# Patient Record
Sex: Female | Born: 1935
Health system: Southern US, Community
[De-identification: ages and names within clinical notes are randomized; demographics above are authoritative.]

## PROBLEM LIST (undated history)

## (undated) DIAGNOSIS — E039 Hypothyroidism, unspecified: Secondary | ICD-10-CM

## (undated) DIAGNOSIS — Z1839 Other retained organic fragments: Secondary | ICD-10-CM

## (undated) DIAGNOSIS — R21 Rash and other nonspecific skin eruption: Secondary | ICD-10-CM

## (undated) DIAGNOSIS — Z8719 Personal history of other diseases of the digestive system: Secondary | ICD-10-CM

## (undated) DIAGNOSIS — F329 Major depressive disorder, single episode, unspecified: Secondary | ICD-10-CM

## (undated) DIAGNOSIS — K529 Noninfective gastroenteritis and colitis, unspecified: Secondary | ICD-10-CM

## (undated) DIAGNOSIS — R269 Unspecified abnormalities of gait and mobility: Secondary | ICD-10-CM

## (undated) DIAGNOSIS — H269 Unspecified cataract: Secondary | ICD-10-CM

## (undated) DIAGNOSIS — F32A Depression, unspecified: Secondary | ICD-10-CM

## (undated) DIAGNOSIS — M1990 Unspecified osteoarthritis, unspecified site: Secondary | ICD-10-CM

## (undated) DIAGNOSIS — E785 Hyperlipidemia, unspecified: Secondary | ICD-10-CM

## (undated) DIAGNOSIS — R911 Solitary pulmonary nodule: Secondary | ICD-10-CM

## (undated) DIAGNOSIS — D494 Neoplasm of unspecified behavior of bladder: Secondary | ICD-10-CM

## (undated) HISTORY — DX: Unspecified abnormalities of gait and mobility: R26.9

## (undated) HISTORY — PX: TOTAL ABDOMINAL HYSTERECTOMY W/ BILATERAL SALPINGOOPHORECTOMY: SHX83

## (undated) HISTORY — DX: Solitary pulmonary nodule: R91.1

## (undated) HISTORY — PX: CATARACT EXTRACTION: SUR2

## (undated) HISTORY — PX: VARICOSE VEIN SURGERY: SHX832

## (undated) HISTORY — DX: Unspecified cataract: H26.9

## (undated) HISTORY — PX: OTHER SURGICAL HISTORY: SHX169

## (undated) HISTORY — DX: Noninfective gastroenteritis and colitis, unspecified: K52.9

---

## 1997-10-10 ENCOUNTER — Other Ambulatory Visit: Admission: RE | Admit: 1997-10-10 | Discharge: 1997-10-10 | Payer: Self-pay | Admitting: Internal Medicine

## 1998-10-12 ENCOUNTER — Other Ambulatory Visit: Admission: RE | Admit: 1998-10-12 | Discharge: 1998-10-12 | Payer: Self-pay | Admitting: Internal Medicine

## 1999-02-28 ENCOUNTER — Encounter: Payer: Self-pay | Admitting: Internal Medicine

## 1999-02-28 ENCOUNTER — Encounter: Admission: RE | Admit: 1999-02-28 | Discharge: 1999-02-28 | Payer: Self-pay | Admitting: Internal Medicine

## 1999-06-22 ENCOUNTER — Encounter: Payer: Self-pay | Admitting: Internal Medicine

## 1999-06-22 ENCOUNTER — Encounter: Admission: RE | Admit: 1999-06-22 | Discharge: 1999-06-22 | Payer: Self-pay | Admitting: Internal Medicine

## 1999-11-02 ENCOUNTER — Other Ambulatory Visit: Admission: RE | Admit: 1999-11-02 | Discharge: 1999-11-02 | Payer: Self-pay | Admitting: Internal Medicine

## 2000-01-08 ENCOUNTER — Encounter: Payer: Self-pay | Admitting: Internal Medicine

## 2000-01-08 ENCOUNTER — Encounter: Admission: RE | Admit: 2000-01-08 | Discharge: 2000-01-08 | Payer: Self-pay | Admitting: Internal Medicine

## 2000-05-20 ENCOUNTER — Encounter: Payer: Self-pay | Admitting: Internal Medicine

## 2000-05-20 ENCOUNTER — Encounter: Admission: RE | Admit: 2000-05-20 | Discharge: 2000-05-20 | Payer: Self-pay | Admitting: Internal Medicine

## 2000-11-13 ENCOUNTER — Other Ambulatory Visit: Admission: RE | Admit: 2000-11-13 | Discharge: 2000-11-13 | Payer: Self-pay | Admitting: Internal Medicine

## 2001-06-30 ENCOUNTER — Encounter: Admission: RE | Admit: 2001-06-30 | Discharge: 2001-06-30 | Payer: Self-pay | Admitting: Internal Medicine

## 2001-06-30 ENCOUNTER — Encounter: Payer: Self-pay | Admitting: Internal Medicine

## 2001-11-27 ENCOUNTER — Other Ambulatory Visit: Admission: RE | Admit: 2001-11-27 | Discharge: 2001-11-27 | Payer: Self-pay | Admitting: Internal Medicine

## 2002-12-16 ENCOUNTER — Encounter: Admission: RE | Admit: 2002-12-16 | Discharge: 2002-12-16 | Payer: Self-pay | Admitting: Internal Medicine

## 2002-12-16 ENCOUNTER — Encounter: Payer: Self-pay | Admitting: Internal Medicine

## 2004-01-10 ENCOUNTER — Encounter: Admission: RE | Admit: 2004-01-10 | Discharge: 2004-01-10 | Payer: Self-pay | Admitting: Internal Medicine

## 2004-01-19 ENCOUNTER — Other Ambulatory Visit: Admission: RE | Admit: 2004-01-19 | Discharge: 2004-01-19 | Payer: Self-pay | Admitting: Internal Medicine

## 2004-06-07 ENCOUNTER — Encounter: Admission: RE | Admit: 2004-06-07 | Discharge: 2004-06-07 | Payer: Self-pay

## 2004-06-13 ENCOUNTER — Ambulatory Visit (HOSPITAL_BASED_OUTPATIENT_CLINIC_OR_DEPARTMENT_OTHER): Admission: RE | Admit: 2004-06-13 | Discharge: 2004-06-13 | Payer: Self-pay

## 2004-06-13 ENCOUNTER — Encounter (INDEPENDENT_AMBULATORY_CARE_PROVIDER_SITE_OTHER): Payer: Self-pay | Admitting: Specialist

## 2004-06-13 ENCOUNTER — Ambulatory Visit (HOSPITAL_COMMUNITY): Admission: RE | Admit: 2004-06-13 | Discharge: 2004-06-13 | Payer: Self-pay

## 2004-06-13 HISTORY — PX: OTHER SURGICAL HISTORY: SHX169

## 2005-03-13 ENCOUNTER — Encounter: Admission: RE | Admit: 2005-03-13 | Discharge: 2005-03-13 | Payer: Self-pay | Admitting: Internal Medicine

## 2006-03-14 ENCOUNTER — Encounter: Admission: RE | Admit: 2006-03-14 | Discharge: 2006-03-14 | Payer: Self-pay | Admitting: Internal Medicine

## 2006-07-01 ENCOUNTER — Ambulatory Visit (HOSPITAL_COMMUNITY): Admission: RE | Admit: 2006-07-01 | Discharge: 2006-07-01 | Payer: Self-pay | Admitting: *Deleted

## 2007-02-17 ENCOUNTER — Encounter: Admission: RE | Admit: 2007-02-17 | Discharge: 2007-02-17 | Payer: Self-pay | Admitting: Internal Medicine

## 2007-04-27 ENCOUNTER — Encounter: Admission: RE | Admit: 2007-04-27 | Discharge: 2007-04-27 | Payer: Self-pay | Admitting: Internal Medicine

## 2008-05-11 ENCOUNTER — Encounter: Admission: RE | Admit: 2008-05-11 | Discharge: 2008-05-11 | Payer: Self-pay | Admitting: Internal Medicine

## 2008-10-06 HISTORY — PX: COLONOSCOPY W/ BIOPSIES: SHX1374

## 2010-05-20 ENCOUNTER — Encounter: Payer: Self-pay | Admitting: Internal Medicine

## 2010-09-14 NOTE — Op Note (Signed)
Monica [name], Monica [name]           ACCOUNT NO.:  [id number]   MEDICAL RECORD NO.:  [id number]          PATIENT TYPE:  AMB   LOCATION:  [facility]                         FACILITY:  [facility]   PHYSICIAN:  Lorre Munroe., M.D.DATE OF BIRTH:  08/28/1927   DATE OF PROCEDURE:  06/13/2004  DATE OF DISCHARGE:                                 OPERATIVE REPORT   PREOPERATIVE DIAGNOSIS:  Anal fistula.   POSTOPERATIVE DIAGNOSIS:  Anal fistula.   OPERATION/PROCEDURE:  Excision of anal fistula.   SURGEON:  Lebron Conners, M.D.   ANESTHESIA:  General and local.   DESCRIPTION OF PROCEDURE:  After the patient was monitored and had general  anesthesia and routine preparation and draping of the perineum, I carefully  examined the distal vagina and perineum and distal anal canal.  I found that  there was a chronically infected indurated area just anterior to the anus  with an opening which I could probe upward alongside the anal canal but  could not make it enter the anal canal with the probe. There was no evidence  of any inflammation or fistula to the lower part of the vagina.  I  thoroughly anesthetized the area with long-acting local anesthetic and put  in an anoscope and I could see a small area of granulation in a crypt and  anterior part of the anus and was able to probe through that point.  I  entirely opened the fistula and trimmed away overlying skin to open it  widely.  It traversed only a small part of the internal sphincter.  However,  there was one deeper area of granulation which went up in the  intersphincteric area.  This was opened and cleaned out and cauterized  chronic granulations, cut a little bit more sphincter to entirely open the  area to allow for good secondary healing.  After getting good hemostasis,  and anesthetizing the area a little bit more thoroughly, I concluded the  procedure by packing the opened fistula area with plain gauze and applying a  small bandage.  The  patient was stable throughout the procedure.      WB/MEDQ  D:  06/13/2004  T:  06/13/2004  Job:  [id number]

## 2010-09-14 NOTE — Op Note (Signed)
[name], Monica [name]           ACCOUNT NO.:  [id number]   MEDICAL RECORD NO.:  [id number]          PATIENT TYPE:  AMB   LOCATION:  ENDO                         FACILITY:  [facility]   PHYSICIAN:  Georgiana Spinner, M.D.    DATE OF BIRTH:  1927-09-05   DATE OF PROCEDURE:  07/01/2006  DATE OF DISCHARGE:                               OPERATIVE REPORT   PROCEDURE:  Colonoscopy.   INDICATIONS:  Rectal bleeding, colon cancer screening.   ANESTHESIA:  Demerol 60 mg, Versed 6 mg.   PROCEDURE:  With the patient mildly sedated in the left lateral  decubitus position the Pentax videoscopic colonoscope was inserted the  rectum and passed under direct vision to the cecum identified by  ileocecal valve and appendiceal orifice both which were photographed.  From this point colonoscope was slowly withdrawn taking circumferential  views of colonic mucosa stopping only in the rectum which appeared  normal on direct and retroflexed view.  The endoscope was straightened  and withdrawn through the anal canal.  The patient's vital signs, pulse  oximeter remained stable.  The patient tolerated procedure well without  apparent complications.   FINDINGS:  Negative examination.   PLAN:  Consider repeat examination possibly in 5-10 years if indicated.           ______________________________  Georgiana Spinner, M.D.     GMO/MEDQ  D:  07/01/2006  T:  07/01/2006  Job:  [id number]

## 2011-06-28 DIAGNOSIS — H2510 Age-related nuclear cataract, unspecified eye: Secondary | ICD-10-CM | POA: Diagnosis not present

## 2011-07-05 DIAGNOSIS — F411 Generalized anxiety disorder: Secondary | ICD-10-CM | POA: Diagnosis not present

## 2011-07-05 DIAGNOSIS — E039 Hypothyroidism, unspecified: Secondary | ICD-10-CM | POA: Diagnosis not present

## 2011-07-05 DIAGNOSIS — F068 Other specified mental disorders due to known physiological condition: Secondary | ICD-10-CM | POA: Diagnosis not present

## 2011-07-08 DIAGNOSIS — E538 Deficiency of other specified B group vitamins: Secondary | ICD-10-CM | POA: Diagnosis not present

## 2011-07-08 DIAGNOSIS — I10 Essential (primary) hypertension: Secondary | ICD-10-CM | POA: Diagnosis not present

## 2011-07-08 DIAGNOSIS — R7309 Other abnormal glucose: Secondary | ICD-10-CM | POA: Diagnosis not present

## 2011-07-08 DIAGNOSIS — E039 Hypothyroidism, unspecified: Secondary | ICD-10-CM | POA: Diagnosis not present

## 2011-07-09 ENCOUNTER — Other Ambulatory Visit: Payer: Self-pay | Admitting: Internal Medicine

## 2011-07-09 DIAGNOSIS — F0390 Unspecified dementia without behavioral disturbance: Secondary | ICD-10-CM

## 2011-07-15 DIAGNOSIS — M79609 Pain in unspecified limb: Secondary | ICD-10-CM | POA: Diagnosis not present

## 2011-07-17 ENCOUNTER — Other Ambulatory Visit: Payer: Self-pay

## 2011-07-30 ENCOUNTER — Ambulatory Visit
Admission: RE | Admit: 2011-07-30 | Discharge: 2011-07-30 | Disposition: A | Discharge status: Home / Self Care | Payer: Medicare Other | Source: Ambulatory Visit | Attending: Internal Medicine | Admitting: Internal Medicine

## 2011-07-30 DIAGNOSIS — R413 Other amnesia: Secondary | ICD-10-CM | POA: Diagnosis not present

## 2011-07-30 DIAGNOSIS — F0390 Unspecified dementia without behavioral disturbance: Secondary | ICD-10-CM | POA: Diagnosis not present

## 2011-07-30 DIAGNOSIS — H538 Other visual disturbances: Secondary | ICD-10-CM | POA: Diagnosis not present

## 2011-08-07 DIAGNOSIS — I8310 Varicose veins of unspecified lower extremity with inflammation: Secondary | ICD-10-CM | POA: Diagnosis not present

## 2011-08-12 DIAGNOSIS — F0390 Unspecified dementia without behavioral disturbance: Secondary | ICD-10-CM | POA: Diagnosis not present

## 2011-08-12 DIAGNOSIS — E7800 Pure hypercholesterolemia, unspecified: Secondary | ICD-10-CM | POA: Diagnosis not present

## 2011-08-12 DIAGNOSIS — E559 Vitamin D deficiency, unspecified: Secondary | ICD-10-CM | POA: Diagnosis not present

## 2011-08-12 DIAGNOSIS — E039 Hypothyroidism, unspecified: Secondary | ICD-10-CM | POA: Diagnosis not present

## 2011-08-30 DIAGNOSIS — H2510 Age-related nuclear cataract, unspecified eye: Secondary | ICD-10-CM | POA: Diagnosis not present

## 2011-08-30 DIAGNOSIS — H3530 Unspecified macular degeneration: Secondary | ICD-10-CM | POA: Diagnosis not present

## 2011-10-07 DIAGNOSIS — H269 Unspecified cataract: Secondary | ICD-10-CM | POA: Diagnosis not present

## 2011-10-07 DIAGNOSIS — H2510 Age-related nuclear cataract, unspecified eye: Secondary | ICD-10-CM | POA: Diagnosis not present

## 2011-10-10 DIAGNOSIS — I8310 Varicose veins of unspecified lower extremity with inflammation: Secondary | ICD-10-CM | POA: Diagnosis not present

## 2011-10-10 DIAGNOSIS — M79609 Pain in unspecified limb: Secondary | ICD-10-CM | POA: Diagnosis not present

## 2011-10-24 DIAGNOSIS — I8310 Varicose veins of unspecified lower extremity with inflammation: Secondary | ICD-10-CM | POA: Diagnosis not present

## 2011-10-24 DIAGNOSIS — M79609 Pain in unspecified limb: Secondary | ICD-10-CM | POA: Diagnosis not present

## 2011-10-24 DIAGNOSIS — M7981 Nontraumatic hematoma of soft tissue: Secondary | ICD-10-CM | POA: Diagnosis not present

## 2011-11-04 DIAGNOSIS — H2510 Age-related nuclear cataract, unspecified eye: Secondary | ICD-10-CM | POA: Diagnosis not present

## 2011-11-04 DIAGNOSIS — H269 Unspecified cataract: Secondary | ICD-10-CM | POA: Diagnosis not present

## 2011-11-28 DIAGNOSIS — Z8719 Personal history of other diseases of the digestive system: Secondary | ICD-10-CM

## 2011-11-28 HISTORY — DX: Personal history of other diseases of the digestive system: Z87.19

## 2011-12-11 DIAGNOSIS — R112 Nausea with vomiting, unspecified: Secondary | ICD-10-CM | POA: Diagnosis not present

## 2011-12-11 DIAGNOSIS — R197 Diarrhea, unspecified: Secondary | ICD-10-CM | POA: Diagnosis not present

## 2011-12-13 ENCOUNTER — Other Ambulatory Visit: Payer: Self-pay | Admitting: Internal Medicine

## 2011-12-13 ENCOUNTER — Ambulatory Visit
Admission: RE | Admit: 2011-12-13 | Discharge: 2011-12-13 | Disposition: A | Discharge status: Home / Self Care | Payer: Medicare Other | Source: Ambulatory Visit | Attending: Internal Medicine | Admitting: Internal Medicine

## 2011-12-13 DIAGNOSIS — K859 Acute pancreatitis without necrosis or infection, unspecified: Secondary | ICD-10-CM

## 2011-12-13 DIAGNOSIS — K8020 Calculus of gallbladder without cholecystitis without obstruction: Secondary | ICD-10-CM | POA: Diagnosis not present

## 2011-12-13 DIAGNOSIS — R110 Nausea: Secondary | ICD-10-CM | POA: Diagnosis not present

## 2011-12-18 DIAGNOSIS — R109 Unspecified abdominal pain: Secondary | ICD-10-CM | POA: Diagnosis not present

## 2011-12-18 DIAGNOSIS — R110 Nausea: Secondary | ICD-10-CM | POA: Diagnosis not present

## 2011-12-18 DIAGNOSIS — R197 Diarrhea, unspecified: Secondary | ICD-10-CM | POA: Diagnosis not present

## 2011-12-31 DIAGNOSIS — R197 Diarrhea, unspecified: Secondary | ICD-10-CM | POA: Diagnosis not present

## 2012-01-01 DIAGNOSIS — R197 Diarrhea, unspecified: Secondary | ICD-10-CM | POA: Diagnosis not present

## 2012-01-01 DIAGNOSIS — R110 Nausea: Secondary | ICD-10-CM | POA: Diagnosis not present

## 2012-01-06 ENCOUNTER — Other Ambulatory Visit: Payer: Self-pay | Admitting: Internal Medicine

## 2012-01-06 DIAGNOSIS — R109 Unspecified abdominal pain: Secondary | ICD-10-CM

## 2012-01-10 ENCOUNTER — Ambulatory Visit
Admission: RE | Admit: 2012-01-10 | Discharge: 2012-01-10 | Disposition: A | Discharge status: Home / Self Care | Payer: Medicare Other | Source: Ambulatory Visit | Attending: Internal Medicine | Admitting: Internal Medicine

## 2012-01-10 DIAGNOSIS — K8020 Calculus of gallbladder without cholecystitis without obstruction: Secondary | ICD-10-CM | POA: Diagnosis not present

## 2012-01-10 DIAGNOSIS — R109 Unspecified abdominal pain: Secondary | ICD-10-CM

## 2012-01-13 DIAGNOSIS — R1110 Vomiting, unspecified: Secondary | ICD-10-CM | POA: Diagnosis not present

## 2012-01-13 DIAGNOSIS — F329 Major depressive disorder, single episode, unspecified: Secondary | ICD-10-CM | POA: Diagnosis not present

## 2012-01-13 DIAGNOSIS — R197 Diarrhea, unspecified: Secondary | ICD-10-CM | POA: Diagnosis not present

## 2012-01-23 DIAGNOSIS — R110 Nausea: Secondary | ICD-10-CM | POA: Diagnosis not present

## 2012-01-23 DIAGNOSIS — R197 Diarrhea, unspecified: Secondary | ICD-10-CM | POA: Diagnosis not present

## 2012-01-23 DIAGNOSIS — K649 Unspecified hemorrhoids: Secondary | ICD-10-CM | POA: Diagnosis not present

## 2012-01-23 DIAGNOSIS — R1110 Vomiting, unspecified: Secondary | ICD-10-CM | POA: Diagnosis not present

## 2012-01-23 DIAGNOSIS — K5289 Other specified noninfective gastroenteritis and colitis: Secondary | ICD-10-CM | POA: Diagnosis not present

## 2012-01-23 DIAGNOSIS — K319 Disease of stomach and duodenum, unspecified: Secondary | ICD-10-CM | POA: Diagnosis not present

## 2012-01-23 DIAGNOSIS — K2980 Duodenitis without bleeding: Secondary | ICD-10-CM | POA: Diagnosis not present

## 2012-01-23 DIAGNOSIS — Z1211 Encounter for screening for malignant neoplasm of colon: Secondary | ICD-10-CM | POA: Diagnosis not present

## 2012-02-04 DIAGNOSIS — E538 Deficiency of other specified B group vitamins: Secondary | ICD-10-CM | POA: Diagnosis not present

## 2012-02-04 DIAGNOSIS — E559 Vitamin D deficiency, unspecified: Secondary | ICD-10-CM | POA: Diagnosis not present

## 2012-02-04 DIAGNOSIS — E7800 Pure hypercholesterolemia, unspecified: Secondary | ICD-10-CM | POA: Diagnosis not present

## 2012-02-04 DIAGNOSIS — R7309 Other abnormal glucose: Secondary | ICD-10-CM | POA: Diagnosis not present

## 2012-02-04 DIAGNOSIS — E039 Hypothyroidism, unspecified: Secondary | ICD-10-CM | POA: Diagnosis not present

## 2012-02-05 DIAGNOSIS — Z23 Encounter for immunization: Secondary | ICD-10-CM | POA: Diagnosis not present

## 2012-02-10 DIAGNOSIS — K5289 Other specified noninfective gastroenteritis and colitis: Secondary | ICD-10-CM | POA: Diagnosis not present

## 2012-02-10 DIAGNOSIS — F329 Major depressive disorder, single episode, unspecified: Secondary | ICD-10-CM | POA: Diagnosis not present

## 2012-02-11 DIAGNOSIS — R3129 Other microscopic hematuria: Secondary | ICD-10-CM | POA: Diagnosis not present

## 2012-02-11 DIAGNOSIS — F329 Major depressive disorder, single episode, unspecified: Secondary | ICD-10-CM | POA: Diagnosis not present

## 2012-03-11 DIAGNOSIS — K5289 Other specified noninfective gastroenteritis and colitis: Secondary | ICD-10-CM | POA: Diagnosis not present

## 2012-03-11 DIAGNOSIS — F329 Major depressive disorder, single episode, unspecified: Secondary | ICD-10-CM | POA: Diagnosis not present

## 2012-03-23 DIAGNOSIS — R3129 Other microscopic hematuria: Secondary | ICD-10-CM | POA: Diagnosis not present

## 2012-05-04 DIAGNOSIS — E7800 Pure hypercholesterolemia, unspecified: Secondary | ICD-10-CM | POA: Diagnosis not present

## 2012-05-04 DIAGNOSIS — Z23 Encounter for immunization: Secondary | ICD-10-CM | POA: Diagnosis not present

## 2012-05-04 DIAGNOSIS — F329 Major depressive disorder, single episode, unspecified: Secondary | ICD-10-CM | POA: Diagnosis not present

## 2012-05-04 DIAGNOSIS — E039 Hypothyroidism, unspecified: Secondary | ICD-10-CM | POA: Diagnosis not present

## 2012-07-20 DIAGNOSIS — F329 Major depressive disorder, single episode, unspecified: Secondary | ICD-10-CM | POA: Diagnosis not present

## 2012-07-20 DIAGNOSIS — K5289 Other specified noninfective gastroenteritis and colitis: Secondary | ICD-10-CM | POA: Diagnosis not present

## 2012-08-03 DIAGNOSIS — E039 Hypothyroidism, unspecified: Secondary | ICD-10-CM | POA: Diagnosis not present

## 2012-08-03 DIAGNOSIS — E7800 Pure hypercholesterolemia, unspecified: Secondary | ICD-10-CM | POA: Diagnosis not present

## 2012-08-19 DIAGNOSIS — R3129 Other microscopic hematuria: Secondary | ICD-10-CM | POA: Diagnosis not present

## 2012-08-20 ENCOUNTER — Other Ambulatory Visit: Payer: Self-pay | Admitting: Urology

## 2012-08-21 DIAGNOSIS — R3129 Other microscopic hematuria: Secondary | ICD-10-CM | POA: Diagnosis not present

## 2012-08-21 DIAGNOSIS — K8020 Calculus of gallbladder without cholecystitis without obstruction: Secondary | ICD-10-CM | POA: Diagnosis not present

## 2012-08-24 DIAGNOSIS — F329 Major depressive disorder, single episode, unspecified: Secondary | ICD-10-CM | POA: Diagnosis not present

## 2012-08-24 DIAGNOSIS — R21 Rash and other nonspecific skin eruption: Secondary | ICD-10-CM | POA: Diagnosis not present

## 2012-08-24 DIAGNOSIS — F0390 Unspecified dementia without behavioral disturbance: Secondary | ICD-10-CM | POA: Diagnosis not present

## 2012-08-24 DIAGNOSIS — E7800 Pure hypercholesterolemia, unspecified: Secondary | ICD-10-CM | POA: Diagnosis not present

## 2012-08-26 ENCOUNTER — Encounter (HOSPITAL_BASED_OUTPATIENT_CLINIC_OR_DEPARTMENT_OTHER): Payer: Self-pay | Admitting: *Deleted

## 2012-08-26 NOTE — Progress Notes (Signed)
NPO AFTER MN. ARRIVES AT 0745. NEEDS HG. WILL TAKE SYNTHROID AM OF SURG W/ SIP OF WATER.

## 2012-08-28 ENCOUNTER — Ambulatory Visit (HOSPITAL_BASED_OUTPATIENT_CLINIC_OR_DEPARTMENT_OTHER)
Admission: RE | Admit: 2012-08-28 | Discharge: 2012-08-28 | Disposition: A | Discharge status: Home / Self Care | Payer: Medicare Other | Source: Ambulatory Visit | Attending: Urology | Admitting: Urology

## 2012-08-28 ENCOUNTER — Ambulatory Visit (HOSPITAL_BASED_OUTPATIENT_CLINIC_OR_DEPARTMENT_OTHER): Payer: Medicare Other | Admitting: Anesthesiology

## 2012-08-28 ENCOUNTER — Encounter (HOSPITAL_BASED_OUTPATIENT_CLINIC_OR_DEPARTMENT_OTHER)
Admission: RE | Disposition: A | Discharge status: Home / Self Care | Payer: Self-pay | Source: Ambulatory Visit | Attending: Urology

## 2012-08-28 ENCOUNTER — Encounter (HOSPITAL_BASED_OUTPATIENT_CLINIC_OR_DEPARTMENT_OTHER): Payer: Self-pay | Admitting: *Deleted

## 2012-08-28 ENCOUNTER — Encounter (HOSPITAL_BASED_OUTPATIENT_CLINIC_OR_DEPARTMENT_OTHER): Payer: Self-pay | Admitting: Anesthesiology

## 2012-08-28 DIAGNOSIS — M129 Arthropathy, unspecified: Secondary | ICD-10-CM | POA: Insufficient documentation

## 2012-08-28 DIAGNOSIS — N3289 Other specified disorders of bladder: Secondary | ICD-10-CM

## 2012-08-28 DIAGNOSIS — E7800 Pure hypercholesterolemia, unspecified: Secondary | ICD-10-CM | POA: Insufficient documentation

## 2012-08-28 DIAGNOSIS — N340 Urethral abscess: Secondary | ICD-10-CM | POA: Diagnosis not present

## 2012-08-28 DIAGNOSIS — Z79899 Other long term (current) drug therapy: Secondary | ICD-10-CM | POA: Diagnosis not present

## 2012-08-28 DIAGNOSIS — F329 Major depressive disorder, single episode, unspecified: Secondary | ICD-10-CM | POA: Insufficient documentation

## 2012-08-28 DIAGNOSIS — D303 Benign neoplasm of bladder: Secondary | ICD-10-CM | POA: Insufficient documentation

## 2012-08-28 DIAGNOSIS — F411 Generalized anxiety disorder: Secondary | ICD-10-CM | POA: Insufficient documentation

## 2012-08-28 DIAGNOSIS — R3129 Other microscopic hematuria: Secondary | ICD-10-CM | POA: Diagnosis not present

## 2012-08-28 DIAGNOSIS — D1809 Hemangioma of other sites: Secondary | ICD-10-CM | POA: Diagnosis not present

## 2012-08-28 DIAGNOSIS — D494 Neoplasm of unspecified behavior of bladder: Secondary | ICD-10-CM | POA: Diagnosis not present

## 2012-08-28 DIAGNOSIS — Z90710 Acquired absence of both cervix and uterus: Secondary | ICD-10-CM | POA: Insufficient documentation

## 2012-08-28 DIAGNOSIS — D414 Neoplasm of uncertain behavior of bladder: Secondary | ICD-10-CM | POA: Diagnosis not present

## 2012-08-28 DIAGNOSIS — E039 Hypothyroidism, unspecified: Secondary | ICD-10-CM | POA: Insufficient documentation

## 2012-08-28 DIAGNOSIS — R918 Other nonspecific abnormal finding of lung field: Secondary | ICD-10-CM | POA: Diagnosis not present

## 2012-08-28 DIAGNOSIS — F3289 Other specified depressive episodes: Secondary | ICD-10-CM | POA: Insufficient documentation

## 2012-08-28 HISTORY — DX: Major depressive disorder, single episode, unspecified: F32.9

## 2012-08-28 HISTORY — DX: Hyperlipidemia, unspecified: E78.5

## 2012-08-28 HISTORY — DX: Solitary pulmonary nodule: R91.1

## 2012-08-28 HISTORY — DX: Hypothyroidism, unspecified: E03.9

## 2012-08-28 HISTORY — DX: Neoplasm of unspecified behavior of bladder: D49.4

## 2012-08-28 HISTORY — DX: Rash and other nonspecific skin eruption: R21

## 2012-08-28 HISTORY — PX: CYSTOSCOPY WITH BIOPSY: SHX5122

## 2012-08-28 HISTORY — DX: Personal history of other diseases of the digestive system: Z87.19

## 2012-08-28 HISTORY — DX: Depression, unspecified: F32.A

## 2012-08-28 HISTORY — DX: Unspecified osteoarthritis, unspecified site: M19.90

## 2012-08-28 HISTORY — DX: Other retained organic fragments: Z18.39

## 2012-08-28 SURGERY — CYSTOSCOPY, WITH BIOPSY
Anesthesia: General | Site: Bladder | Wound class: Clean Contaminated

## 2012-08-28 MED ORDER — PHENAZOPYRIDINE HCL 200 MG PO TABS: 200.0000 mg | ORAL_TABLET | Freq: Three times a day (TID) | ORAL | Status: DC | PRN

## 2012-08-28 MED ORDER — ACETAMINOPHEN 10 MG/ML IV SOLN
INTRAVENOUS | Status: DC | PRN
  Administered 2012-08-28: 1000 mg via INTRAVENOUS

## 2012-08-28 MED ORDER — LACTATED RINGERS IV SOLN
INTRAVENOUS | Status: DC
  Filled 2012-08-28: qty 1000

## 2012-08-28 MED ORDER — ONDANSETRON HCL 4 MG/2ML IJ SOLN
INTRAMUSCULAR | Status: DC | PRN
  Administered 2012-08-28: 4 mg via INTRAVENOUS

## 2012-08-28 MED ORDER — PROPOFOL 10 MG/ML IV BOLUS
INTRAVENOUS | Status: DC | PRN
  Administered 2012-08-28: 200 mg via INTRAVENOUS

## 2012-08-28 MED ORDER — TRAMADOL-ACETAMINOPHEN 37.5-325 MG PO TABS: 1.0000 | ORAL_TABLET | Freq: Four times a day (QID) | ORAL | Status: DC | PRN

## 2012-08-28 MED ORDER — TRAMADOL HCL 50 MG PO TABS
50.0000 mg | ORAL_TABLET | Freq: Four times a day (QID) | ORAL | Status: DC | PRN
  Administered 2012-08-28: 50 mg via ORAL
  Filled 2012-08-28: qty 1

## 2012-08-28 MED ORDER — CEFAZOLIN SODIUM-DEXTROSE 2-3 GM-% IV SOLR
2.0000 g | INTRAVENOUS | Status: AC
  Administered 2012-08-28: 2 g via INTRAVENOUS
  Filled 2012-08-28: qty 50

## 2012-08-28 MED ORDER — BELLADONNA ALKALOIDS-OPIUM 16.2-60 MG RE SUPP
RECTAL | Status: DC | PRN
  Administered 2012-08-28: 1 via RECTAL

## 2012-08-28 MED ORDER — DEXAMETHASONE SODIUM PHOSPHATE 4 MG/ML IJ SOLN
INTRAMUSCULAR | Status: DC | PRN
  Administered 2012-08-28: 4 mg via INTRAVENOUS

## 2012-08-28 MED ORDER — PHENAZOPYRIDINE HCL 200 MG PO TABS
200.0000 mg | ORAL_TABLET | Freq: Three times a day (TID) | ORAL | Status: DC
  Administered 2012-08-28: 200 mg via ORAL
  Filled 2012-08-28: qty 1

## 2012-08-28 MED ORDER — STERILE WATER FOR IRRIGATION IR SOLN
Status: DC | PRN
  Administered 2012-08-28: 3000 mL

## 2012-08-28 MED ORDER — LIDOCAINE HCL (CARDIAC) 20 MG/ML IV SOLN
INTRAVENOUS | Status: DC | PRN
  Administered 2012-08-28: 50 mg via INTRAVENOUS

## 2012-08-28 MED ORDER — LACTATED RINGERS IV SOLN
INTRAVENOUS | Status: DC
  Administered 2012-08-28 (×3): via INTRAVENOUS
  Filled 2012-08-28: qty 1000

## 2012-08-28 MED ORDER — FENTANYL CITRATE 0.05 MG/ML IJ SOLN
INTRAMUSCULAR | Status: DC | PRN
  Administered 2012-08-28 (×3): 25 ug via INTRAVENOUS

## 2012-08-28 MED ORDER — FENTANYL CITRATE 0.05 MG/ML IJ SOLN
25.0000 ug | INTRAMUSCULAR | Status: DC | PRN
  Administered 2012-08-28: 25 ug via INTRAVENOUS
  Filled 2012-08-28: qty 1

## 2012-08-28 SURGICAL SUPPLY — 23 items
BAG DRAIN URO-CYSTO SKYTR STRL (DRAIN) ×2 IMPLANT
BAG DRN UROCATH (DRAIN) ×1
BOOTIES KNEE HIGH SLOAN (MISCELLANEOUS) ×2 IMPLANT
CANISTER SUCT LVC 12 LTR MEDI- (MISCELLANEOUS) ×1 IMPLANT
CLOTH BEACON ORANGE TIMEOUT ST (SAFETY) ×2 IMPLANT
DRAPE CAMERA CLOSED 9X96 (DRAPES) ×2 IMPLANT
ELECT REM PT RETURN 9FT ADLT (ELECTROSURGICAL) ×2
ELECTRODE REM PT RTRN 9FT ADLT (ELECTROSURGICAL) ×1 IMPLANT
GLOVE BIO SURGEON STRL SZ 6.5 (GLOVE) ×2 IMPLANT
GLOVE BIO SURGEON STRL SZ7.5 (GLOVE) ×2 IMPLANT
GLOVE ECLIPSE 6.0 STRL STRAW (GLOVE) ×2 IMPLANT
GLOVE INDICATOR 6.5 STRL GRN (GLOVE) ×1 IMPLANT
GOWN PREVENTION PLUS LG XLONG (DISPOSABLE) ×3 IMPLANT
GOWN STRL REIN XL XLG (GOWN DISPOSABLE) ×2 IMPLANT
NDL SAFETY ECLIPSE 18X1.5 (NEEDLE) IMPLANT
NDL SPNL 22GX7 QUINCKE BK (NEEDLE) IMPLANT
NEEDLE HYPO 18GX1.5 SHARP (NEEDLE)
NEEDLE HYPO 22GX1.5 SAFETY (NEEDLE) IMPLANT
NEEDLE SPNL 22GX7 QUINCKE BK (NEEDLE) IMPLANT
NS IRRIG 500ML POUR BTL (IV SOLUTION) IMPLANT
PACK CYSTOSCOPY (CUSTOM PROCEDURE TRAY) ×2 IMPLANT
SYR 20CC LL (SYRINGE) IMPLANT
WATER STERILE IRR 3000ML UROMA (IV SOLUTION) ×2 IMPLANT

## 2012-08-28 NOTE — Anesthesia Procedure Notes (Signed)
Procedure Name: LMA Insertion Date/Time: 08/28/2012 9:08 AM Performed by: Fran Lowes Pre-anesthesia Checklist: Patient identified, Emergency Drugs available, Suction available and Patient being monitored Patient Re-evaluated:Patient Re-evaluated prior to inductionOxygen Delivery Method: Circle System Utilized Preoxygenation: Pre-oxygenation with 100% oxygen Intubation Type: IV induction Ventilation: Mask ventilation without difficulty LMA: LMA inserted LMA Size: 4.0 Number of attempts: 1 Airway Equipment and Method: bite block Placement Confirmation: positive ETCO2 Tube secured with: Tape Dental Injury: Teeth and Oropharynx as per pre-operative assessment

## 2012-08-28 NOTE — Op Note (Signed)
Pre-operative diagnosis :  Bladder tumor with microhematuria  Postoperative diagnosis: Same    Operation:  Cystourethroscopy, cold cup excisional biopsy of  multiple left lateral wall bladder tumors, #10.5 cm left posterior wall; #21.5 cm left lateral wall with cauterization of bladder tumor base   Surgeon:  S. Patsi Sears, MD  First assistant:  None   Anesthesia:  , general  Preparation:  After appropriate preanesthesia, the patient was brought the operative room, placed on the operating table in the dorsal supine position where general LMA anesthesia was introduced. She was replaced in the dorsal lithotomy position with pubis was prepped with Betadine solution and draped in usual fashion. Armband was double checked. History was reviewed.   Review history:  79 yo female referred by Dr. Selena Batten for further evaluation of microhematuria. She has some nocturia. She had an abdominal u/s on 01/10/12 which showed gallstones only. Kidneys looked normal. CT abd/pelvis w/ contrast on 12/13/11 showed: 1. lung base nodules 2. gallstones.  No tobacco history. No hx of stones. Hx of gall stones. Hx colitis ( ? reason), anxiety and depression-1 caregiver for husband. Nol hx of HBP. She takes fish oil prn. Bladder abnormality seen on office cysto, for biopsy.  Past Medical History   Statement of  Likelihood of Success: Excellent. TIME-OUT observed.:  Procedure:  Cystoscopy was accomplished. The urethra was within normal limits. There was a urethral carbuncle noted which was small. The vagina was postmenopausal. The bladder base was within normal limits. There was clear reflux from both ureteral orifices, located in normal position on the trigone. Inspection of the bladder mucosa was accomplished with both the 30 and 70 lenses. 2 abnormal areas were identified, as seen in the office on flexible cystoscopy, just to the left of the midline in the posterior bladder wall, and on the left lateral mucosal surface. These  lesions appeared flat, and benign, although hemorrhagic. I was able to cold cup biopsy each lesion. The first lesion measured 0.5 cm and a second lesion measuring 1.5 cm. each lesion was then cauterized with the Bugbee electrode. The patient received 2 g of IV Ancef, 1 g of IV Tylenol. The bladder was drained of fluid, and the patient was awakened and taken to recovery room in good condition. I've sees were sent to laboratory for evaluation. The patient was awakened and taken to recovery room in good condition.

## 2012-08-28 NOTE — H&P (Signed)
Problems  1. Microscopic Hematuria 599.72  History of Present Illness            79 yo female referred by Dr. Selena Batten for further evaluation of microhematuria.  She has some nocturia.  She had an abdominal u/s on 01/10/12 which showed gallstones only.  Kidneys looked normal.  CT abd/pelvis w/ contrast on 12/13/11 showed:  1.  lung base nodules  2.  gallstones.    No tobacco history. No hx of stones. Hx of gall stones. Hx colitis ( ? reason), anxiety and depression-1 caregiver for husband. Nol hx of HBP. She takes fish oil prn.   Past Medical History Problems  1. History of  Anxiety (Symptom) 300.00 2. History of  Arthritis V13.4 3. History of  Depression 311 4. History of  Hypercholesterolemia 272.0 5. History of  Hypothyroidism 244.9  Surgical History Problems  1. History of  Hysterectomy V45.77  Current Meds 1. Align 4 MG Oral Capsule; Therapy: (Recorded:25Nov2013) to 2. Aricept 10 MG Oral Tablet; Therapy: (Recorded:25Nov2013) to 3. Crestor 10 MG Oral Tablet; Therapy: (Recorded:25Nov2013) to 4. Fish Oil CAPS; Therapy: (Recorded:25Nov2013) to 5. PriLOSEC 20 MG Oral Capsule Delayed Release; Therapy: (Recorded:25Nov2013) to 6. Synthroid 75 MCG Oral Tablet; Therapy: (Recorded:25Nov2013) to 7. Vitamin B-12 1000 MCG Oral Tablet; Therapy: (Recorded:25Nov2013) to 8. Vitamin D 1000 UNIT Oral Tablet; Therapy: (Recorded:25Nov2013) to  Allergies Medication  1. No Known Drug Allergies  Family History Problems  1. Family history of  Family Health Status Number Of Children 1 daughter 2. Family history of  Father Deceased At Age 62 3. Family history of  Mother Deceased At Age 12 4. Paternal history of  Stroke Syndrome V17.1  Social History Problems  1. Caffeine Use 3 per day 2. Marital History - Currently Married 3. Never A Smoker 4. Occupation: Retired Nurse, children's  5. History of  Alcohol Use  Results/Data Selected Results  AU CT-HEMATURIA PROTOCOL 25Apr2014 12:00AM Jethro Bolus    Test Name Result Flag Reference  ** RADIOLOGY REPORT BY [facility] **   *RADIOLOGY REPORT*  Clinical Data: Micro hematuria. Bladder mass identified on cystoscopy.  CT ABDOMEN AND PELVIS WITHOUT AND WITH CONTRAST  Technique: Multidetector CT imaging of the abdomen and pelvis was performed without contrast material in one or both body regions, followed by contrast material(s) and further sections in one or both body regions.  Contrast: 125 ml Isovue 300  Comparison: 12/13/2011 and old chest CT from 2008  Findings: No evidence of renal calculi or hydronephrosis. No evidence of ureteral calculi or dilatation. No bladder calculi identified. A 2 cm gallstone is again seen, however there are no signs of cholecystitis or biliary dilatation.  Postcontrast phases show no evidence of renal masses or other parenchymal lesions. Excretory phase shows a no evidence of masses or filling defects involving the intrarenal collecting systems, ureters, or bladder. Prior hysterectomy noted. Adnexal regions are unremarkable appearance. No pelvic masses or lymphadenopathy identified.  The liver, spleen, pancreas, and adrenal glands are normal appearance. No abdominal soft tissue masses or lymphadenopathy identified. No evidence of inflammatory process or abnormal fluid collections. No evidence of dilated bowel loops. No suspicious bone lesions identified. A benign hamartoma in the left lower lobe as well as other tiny pulmonary nodules and right lower lung remains stable.  IMPRESSION:  1. Stable exam. No radiographic evidence of urinary tract neoplasm, urolithiasis, or hydronephrosis. 2. Cholelithiasis. No radiographic evidence of cholecystitis.   Original Report Authenticated By: Myles Rosenthal, M.D.   Procedure  Procedure: Cystoscopy  Chaperone Present: laura.  Indication: Hematuria.  Informed Consent: Risks, benefits, and potential adverse events were discussed and  informed consent was obtained from the patient.  Prep: The patient was prepped with betadine.  Procedure Note:  Urethral meatus:. No abnormalities.  Anterior urethra: No abnormalities.  Prostatic urethra: No abnormalities.  Bladder: Visulization was clear. The ureteral orifices were in the normal anatomic position bilaterally and had clear efflux of urine. A systematic survey of the bladder demonstrated no bladder tumors or stones. The mucosa was smooth without abnormalities. A solitary tumor was visualized in the bladder. A papillary tumor was seen in the bladder. This tumor was located on the left side, on the posterior aspect, near the dome of the bladder. The patient tolerated the procedure well.  Complications: None.    Assessment Assessed  1. Microscopic Hematuria 599.72   79 yo female with solitary bladder tumor, unusual in appearance.   Plan  Cysto and TURBT.   Signatures Electronically signed by : Jethro Bolus, M.D.; Aug 24 2012  2:17PM

## 2012-08-28 NOTE — Anesthesia Postprocedure Evaluation (Signed)
  Anesthesia Post-op Note  Patient: Monica [name]  Procedure(s) Performed: Procedure(s) (LRB): COLD CUP EXCISIONAL BIOPSY LEFT POSTERIOR BLADDER WALL MASS AND CAUTERIZE  (N/A)  Patient Location: PACU  Anesthesia Type: General  Level of Consciousness: awake and alert   Airway and Oxygen Therapy: Patient Spontanous Breathing  Post-op Pain: mild  Post-op Assessment: Post-op Vital signs reviewed, Patient's Cardiovascular Status Stable, Respiratory Function Stable, Patent Airway and No signs of Nausea or vomiting  Last Vitals:  Filed Vitals:   08/28/12 1015  BP: 139/66  Pulse: 58  Temp:   Resp: 15    Post-op Vital Signs: stable   Complications: No apparent anesthesia complications

## 2012-08-28 NOTE — Interval H&P Note (Signed)
History and Physical Interval Note:  08/28/2012 8:52 AM  Monica [name]  has presented today for surgery, with the diagnosis of BLADDER TUMOR  The various methods of treatment have been discussed with the patient and family. After consideration of risks, benefits and other options for treatment, the patient has consented to  Procedure(s): COLD CUP EXCISIONAL BIOPSY LEFT POSTERIOR BLADDER WALL MASS AND CAUTERIZE BASE (N/A) as a surgical intervention .  The patient's history has been reviewed, patient examined, no change in status, stable for surgery.  I have reviewed the patient's chart and labs.  Questions were answered to the patient's satisfaction.     Jethro Bolus I

## 2012-08-28 NOTE — Anesthesia Preprocedure Evaluation (Addendum)
Anesthesia Evaluation  Patient identified by MRN, date of birth, ID band Patient awake    Reviewed: Allergy & Precautions, H&P , NPO status , Patient's Chart, lab work & pertinent test results  Airway Mallampati: II TM Distance: >3 FB Neck ROM: full    Dental  (+) Edentulous Upper and Dental Advisory Given   Pulmonary neg pulmonary ROS,  breath sounds clear to auscultation  Pulmonary exam normal       Cardiovascular Exercise Tolerance: Good negative cardio ROS  Rhythm:regular Rate:Normal     Neuro/Psych negative neurological ROS  negative psych ROS   GI/Hepatic negative GI ROS, Neg liver ROS,   Endo/Other  negative endocrine ROSHypothyroidism   Renal/GU negative Renal ROS  negative genitourinary   Musculoskeletal   Abdominal   Peds  Hematology negative hematology ROS (+)   Anesthesia Other Findings   Reproductive/Obstetrics negative OB ROS                          Anesthesia Physical Anesthesia Plan  ASA: II  Anesthesia Plan: General   Post-op Pain Management:    Induction: Intravenous  Airway Management Planned: LMA  Additional Equipment:   Intra-op Plan:   Post-operative Plan:   Informed Consent: I have reviewed the patients History and Physical, chart, labs and discussed the procedure including the risks, benefits and alternatives for the proposed anesthesia with the patient or authorized representative who has indicated his/her understanding and acceptance.   Dental Advisory Given  Plan Discussed with: CRNA and Surgeon  Anesthesia Plan Comments:         Anesthesia Quick Evaluation

## 2012-08-28 NOTE — Transfer of Care (Signed)
Immediate Anesthesia Transfer of Care Note  Patient: Monica [name]  Procedure(s) Performed: Procedure(s) (LRB): COLD CUP EXCISIONAL BIOPSY LEFT POSTERIOR BLADDER WALL MASS AND CAUTERIZE  (N/A)  Patient Location: Patient transported to PACU with oxygen via face mask at 4 Liters / Min  Anesthesia Type: General  Level of Consciousness: awake and alert   Airway & Oxygen Therapy: Patient Spontanous Breathing and Patient connected to face mask oxygen  Post-op Assessment: Report given to PACU RN and Post -op Vital signs reviewed and stable  Post vital signs: Reviewed and stable  Dentition: Teeth and oropharynx remain in pre-op condition  Complications: No apparent anesthesia complications

## 2012-08-31 ENCOUNTER — Encounter (HOSPITAL_BASED_OUTPATIENT_CLINIC_OR_DEPARTMENT_OTHER): Payer: Self-pay | Admitting: Urology

## 2012-09-03 LAB — POCT HEMOGLOBIN-HEMACUE: Hemoglobin: 13.6 g/dL (ref 12.0–15.0)

## 2012-09-04 DIAGNOSIS — D1800 Hemangioma unspecified site: Secondary | ICD-10-CM | POA: Diagnosis not present

## 2012-09-04 DIAGNOSIS — R3129 Other microscopic hematuria: Secondary | ICD-10-CM | POA: Diagnosis not present

## 2012-11-23 DIAGNOSIS — F0390 Unspecified dementia without behavioral disturbance: Secondary | ICD-10-CM | POA: Diagnosis not present

## 2012-12-05 ENCOUNTER — Emergency Department (HOSPITAL_COMMUNITY)
Admission: EM | Admit: 2012-12-05 | Discharge: 2012-12-05 | Disposition: A | Discharge status: Home / Self Care | Payer: Medicare Other | Attending: Emergency Medicine | Admitting: Emergency Medicine

## 2012-12-05 ENCOUNTER — Encounter (HOSPITAL_COMMUNITY): Payer: Self-pay | Admitting: *Deleted

## 2012-12-05 ENCOUNTER — Emergency Department (HOSPITAL_COMMUNITY): Payer: Medicare Other

## 2012-12-05 DIAGNOSIS — F329 Major depressive disorder, single episode, unspecified: Secondary | ICD-10-CM | POA: Insufficient documentation

## 2012-12-05 DIAGNOSIS — M25569 Pain in unspecified knee: Secondary | ICD-10-CM | POA: Diagnosis not present

## 2012-12-05 DIAGNOSIS — Z23 Encounter for immunization: Secondary | ICD-10-CM | POA: Diagnosis not present

## 2012-12-05 DIAGNOSIS — L02419 Cutaneous abscess of limb, unspecified: Secondary | ICD-10-CM | POA: Diagnosis not present

## 2012-12-05 DIAGNOSIS — Z8719 Personal history of other diseases of the digestive system: Secondary | ICD-10-CM | POA: Diagnosis not present

## 2012-12-05 DIAGNOSIS — Z8739 Personal history of other diseases of the musculoskeletal system and connective tissue: Secondary | ICD-10-CM | POA: Insufficient documentation

## 2012-12-05 DIAGNOSIS — E785 Hyperlipidemia, unspecified: Secondary | ICD-10-CM | POA: Diagnosis not present

## 2012-12-05 DIAGNOSIS — E039 Hypothyroidism, unspecified: Secondary | ICD-10-CM | POA: Diagnosis not present

## 2012-12-05 DIAGNOSIS — Z87448 Personal history of other diseases of urinary system: Secondary | ICD-10-CM | POA: Diagnosis not present

## 2012-12-05 DIAGNOSIS — L0291 Cutaneous abscess, unspecified: Secondary | ICD-10-CM | POA: Diagnosis not present

## 2012-12-05 DIAGNOSIS — F3289 Other specified depressive episodes: Secondary | ICD-10-CM | POA: Insufficient documentation

## 2012-12-05 DIAGNOSIS — Z79899 Other long term (current) drug therapy: Secondary | ICD-10-CM | POA: Diagnosis not present

## 2012-12-05 DIAGNOSIS — L0390 Cellulitis, unspecified: Secondary | ICD-10-CM | POA: Insufficient documentation

## 2012-12-05 DIAGNOSIS — S8990XA Unspecified injury of unspecified lower leg, initial encounter: Secondary | ICD-10-CM | POA: Diagnosis not present

## 2012-12-05 DIAGNOSIS — L03119 Cellulitis of unspecified part of limb: Secondary | ICD-10-CM | POA: Diagnosis not present

## 2012-12-05 MED ORDER — TETANUS-DIPHTH-ACELL PERTUSSIS 5-2.5-18.5 LF-MCG/0.5 IM SUSP
0.5000 mL | Freq: Once | INTRAMUSCULAR | Status: AC
  Administered 2012-12-05: 0.5 mL via INTRAMUSCULAR
  Filled 2012-12-05: qty 0.5

## 2012-12-05 MED ORDER — DOXYCYCLINE HYCLATE 100 MG PO TABS
100.0000 mg | ORAL_TABLET | Freq: Once | ORAL | Status: AC
  Administered 2012-12-05: 100 mg via ORAL
  Filled 2012-12-05: qty 1

## 2012-12-05 MED ORDER — DOXYCYCLINE HYCLATE 100 MG PO CAPS: 100.0000 mg | ORAL_CAPSULE | Freq: Two times a day (BID) | ORAL | Status: DC

## 2012-12-05 NOTE — ED Notes (Signed)
ZOX:[id number]<AV> Expected date:<BR> Expected time:<BR> Means of arrival:<BR> Comments:<BR> Hold
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
## 2012-12-05 NOTE — ED Provider Notes (Signed)
Medical screening examination/treatment/procedure(s) were conducted as a shared visit with non-physician practitioner(s) and myself.  I personally evaluated the patient during the encounter  Small anterior knee abscess.  Incision and drainage performed.  We'll place on antibiotics.  Warm water soaks.  Return to ER for worsening symptoms  Lyanne Co, MD 12/05/12 1535

## 2012-12-05 NOTE — ED Notes (Signed)
Please see triage note for nursing assessment. 

## 2012-12-05 NOTE — ED Notes (Signed)
Pt states last weekend she was visiting her husband on the palliative unit at [facility] when she knelt down on her left knee to pick something up off the floor.  Pt states upon kneeling she immediately felt a sharp pain in her left knee as though she had stuck her knee on a sharp object.  Pt presents with redness and swelling to left knee cap.  Clear drainage is noted.  Area is not exceptionally warm to touch.  Pulses present in LLE.  Pt denies N/V/D and fever.  Pt is able to ambulate on LLE.

## 2013-01-18 DIAGNOSIS — L301 Dyshidrosis [pompholyx]: Secondary | ICD-10-CM | POA: Diagnosis not present

## 2013-01-18 DIAGNOSIS — E785 Hyperlipidemia, unspecified: Secondary | ICD-10-CM | POA: Diagnosis not present

## 2013-01-18 DIAGNOSIS — Z23 Encounter for immunization: Secondary | ICD-10-CM | POA: Diagnosis not present

## 2013-04-27 DIAGNOSIS — E785 Hyperlipidemia, unspecified: Secondary | ICD-10-CM | POA: Diagnosis not present

## 2013-05-03 DIAGNOSIS — E039 Hypothyroidism, unspecified: Secondary | ICD-10-CM | POA: Diagnosis not present

## 2013-05-03 DIAGNOSIS — R7309 Other abnormal glucose: Secondary | ICD-10-CM | POA: Diagnosis not present

## 2013-05-03 DIAGNOSIS — E7800 Pure hypercholesterolemia, unspecified: Secondary | ICD-10-CM | POA: Diagnosis not present

## 2013-05-03 DIAGNOSIS — F0390 Unspecified dementia without behavioral disturbance: Secondary | ICD-10-CM | POA: Diagnosis not present

## 2013-05-19 ENCOUNTER — Encounter: Payer: Self-pay | Admitting: Neurology

## 2013-05-20 ENCOUNTER — Ambulatory Visit (INDEPENDENT_AMBULATORY_CARE_PROVIDER_SITE_OTHER): Payer: Medicare Other | Admitting: Neurology

## 2013-05-20 ENCOUNTER — Encounter: Payer: Self-pay | Admitting: Neurology

## 2013-05-20 VITALS — BP 136/78 | HR 83 | Ht 62.5 in | Wt 154.0 lb

## 2013-05-20 DIAGNOSIS — R413 Other amnesia: Secondary | ICD-10-CM

## 2013-05-20 NOTE — Patient Instructions (Signed)
Dementia Dementia is a general term for problems with brain function. A person with dementia has memory loss and a hard time with at least one other brain function such as thinking, speaking, or problem solving. Dementia can affect social functioning, how you do your job, your mood, or your personality. The changes may be hidden for a long time. The earliest forms of this disease are usually not detected by family or friends. Dementia can be:  Irreversible.  Potentially reversible.  Partially reversible.  Progressive. This means it can get worse over time. CAUSES  Irreversible dementia causes may include:  Degeneration of brain cells (Alzheimer's disease or lewy body dementia).  Multiple small strokes (vascular dementia).  Infection (chronic meningitis or Creutzfelt-Jakob disease).  Frontotemporal dementia. This affects younger people, age 40 to 70, compared to those who have Alzheimer's disease.  Dementia associated with other disorders like Parkinson's disease, Huntington's disease, or HIV-associated dementia. Potentially or partially reversible dementia causes may include:  Medicines.  Metabolic causes such as excessive alcohol intake, vitamin B12 deficiency, or thyroid disease.  Masses or pressure in the brain such as a tumor, blood clot, or hydrocephalus. SYMPTOMS  Symptoms are often hard to detect. Family members or coworkers may not notice them early in the disease process. Different people with dementia may have different symptoms. Symptoms can include:  A hard time with memory, especially recent memory. Long-term memory may not be impaired.  Asking the same question multiple times or forgetting something someone just said.  A hard time speaking your thoughts or finding certain words.  A hard time solving problems or performing familiar tasks (such as how to use a telephone).  Sudden changes in mood.  Changes in personality, especially increasing moodiness or  mistrust.  Depression.  A hard time understanding complex ideas that were never a problem in the past. DIAGNOSIS  There are no specific tests for dementia.   Your caregiver may recommend a thorough evaluation. This is because some forms of dementia can be reversible. The evaluation will likely include a physical exam and getting a detailed history from you and a family member. The history often gives the best clues and suggestions for a diagnosis.  Memory testing may be done. A detailed brain function evaluation called neuropsychologic testing may be helpful.  Lab tests and brain imaging (such as a CT scan or MRI scan) are sometimes important.  Sometimes observation and re-evaluation over time is very helpful. TREATMENT  Treatment depends on the cause.   If the problem is a vitamin deficiency, it may be helped or cured with supplements.  For dementias such as Alzheimer's disease, medicines are available to stabilize or slow the course of the disease. There are no cures for this type of dementia.  Your caregiver can help direct you to groups, organizations, and other caregivers to help with decisions in the care of you or your loved one. HOME CARE INSTRUCTIONS The care of individuals with dementia is varied and dependent upon the progression of the dementia. The following suggestions are intended for the person living with, or caring for, the person with dementia.  Create a safe environment.  Remove the locks on bathroom doors to prevent the person from accidentally locking himself or herself in.  Use childproof latches on kitchen cabinets and any place where cleaning supplies, chemicals, or alcohol are kept.  Use childproof covers in unused electrical outlets.  Install childproof devices to keep doors and windows secured.  Remove stove knobs or install safety   knobs and an automatic shut-off on the stove.  Lower the temperature on water heaters.  Label medicines and keep them  locked up.  Secure knives, lighters, matches, power tools, and guns, and keep these items out of reach.  Keep the house free from clutter. Remove rugs or anything that might contribute to a fall.  Remove objects that might break and hurt the person.  Make sure lighting is good, both inside and outside.  Install grab rails as needed.  Use a monitoring device to alert you to falls or other needs for help.  Reduce confusion.  Keep familiar objects and people around.  Use night lights or dim lights at night.  Label items or areas.  Use reminders, notes, or directions for daily activities or tasks.  Keep a simple, consistent routine for waking, meals, bathing, dressing, and bedtime.  Create a calm, quiet environment.  Place large clocks and calendars prominently.  Display emergency numbers and home address near all telephones.  Use cues to establish different times of the day. An example is to open curtains to let the natural light in during the day.   Use effective communication.  Choose simple words and short sentences.  Use a gentle, calm tone of voice.  Be careful not to interrupt.  If the person is struggling to find a word or communicate a thought, try to provide the word or thought.  Ask one question at a time. Allow the person ample time to answer questions. Repeat the question again if the person does not respond.  Reduce nighttime restlessness.  Provide a comfortable bed.  Have a consistent nighttime routine.  Ensure a regular walking or physical activity schedule. Involve the person in daily activities as much as possible.  Limit napping during the day.  Limit caffeine.  Attend social events that stimulate rather than overwhelm the senses.  Encourage good nutrition and hydration.  Reduce distractions during meal times and snacks.  Avoid foods that are too hot or too cold.  Monitor chewing and swallowing ability.  Continue with routine vision,  hearing, dental, and medical screenings.  Only give over-the-counter or prescription medicines as directed by the caregiver.  Monitor driving abilities. Do not allow the person to drive when safe driving is no longer possible.  Register with an identification program which could provide location assistance in the event of a missing person situation. SEEK MEDICAL CARE IF:   New behavioral problems start such as moodiness, aggressiveness, or seeing things that are not there (hallucinations).  Any new problem with brain function happens. This includes problems with balance, speech, or falling a lot.  Problems with swallowing develop.  Any symptoms of other illness happen. Small changes or worsening in any aspect of brain function can be a sign that the illness is getting worse. It can also be a sign of another medical illness such as infection. Seeing a caregiver right away is important. SEEK IMMEDIATE MEDICAL CARE IF:   A fever develops.  New or worsened confusion develops.  New or worsened sleepiness develops.  Staying awake becomes hard to do. Document Released: 10/09/2000 Document Revised: 07/08/2011 Document Reviewed: 09/10/2010 Exit[employer] Patient Information 2014 [employer].  
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
## 2013-05-26 ENCOUNTER — Telehealth: Payer: Self-pay

## 2013-05-26 MED ORDER — AXONA PO PACK: 40.0000 g | PACK | Freq: Every day | ORAL | Status: DC

## 2013-05-26 NOTE — Telephone Encounter (Signed)
Patient called stating she would like a Rx called in for [place].  She would like it sent to [facility] to get the best cost.   Last OV says:  The patient has opted to give the Axona a trial. Samples were given. If the patient does well with the samples, she is to contact our office, and we will call in a prescription for her. I have added this drug to her med list and sent the Rx.  Patient is aware.

## 2013-07-23 DIAGNOSIS — H3530 Unspecified macular degeneration: Secondary | ICD-10-CM | POA: Diagnosis not present

## 2013-08-02 DIAGNOSIS — R7309 Other abnormal glucose: Secondary | ICD-10-CM | POA: Diagnosis not present

## 2013-08-02 DIAGNOSIS — F0390 Unspecified dementia without behavioral disturbance: Secondary | ICD-10-CM | POA: Diagnosis not present

## 2013-08-02 DIAGNOSIS — E039 Hypothyroidism, unspecified: Secondary | ICD-10-CM | POA: Diagnosis not present

## 2013-08-02 DIAGNOSIS — E7800 Pure hypercholesterolemia, unspecified: Secondary | ICD-10-CM | POA: Diagnosis not present

## 2013-08-09 DIAGNOSIS — E7800 Pure hypercholesterolemia, unspecified: Secondary | ICD-10-CM | POA: Diagnosis not present

## 2013-08-09 DIAGNOSIS — F0390 Unspecified dementia without behavioral disturbance: Secondary | ICD-10-CM | POA: Diagnosis not present

## 2013-08-09 DIAGNOSIS — E039 Hypothyroidism, unspecified: Secondary | ICD-10-CM | POA: Diagnosis not present

## 2013-08-09 DIAGNOSIS — R7309 Other abnormal glucose: Secondary | ICD-10-CM | POA: Diagnosis not present

## 2013-11-16 DIAGNOSIS — E039 Hypothyroidism, unspecified: Secondary | ICD-10-CM | POA: Diagnosis not present

## 2013-11-16 DIAGNOSIS — E7800 Pure hypercholesterolemia, unspecified: Secondary | ICD-10-CM | POA: Diagnosis not present

## 2013-11-16 DIAGNOSIS — R7309 Other abnormal glucose: Secondary | ICD-10-CM | POA: Diagnosis not present

## 2013-11-17 ENCOUNTER — Ambulatory Visit: Payer: Self-pay | Admitting: Adult Health

## 2013-12-02 IMAGING — US US ABDOMEN COMPLETE
1 series · 14 of 25 positions shown · non-contrast
Comparison: CT 12/13/2011

CLINICAL DATA: Abdominal pain.

COMPLETE ABDOMINAL ULTRASOUND

[Series 1: us abdomen complete · 0.30mm/px · 14 of 66 slices shown]
[im 1/66]
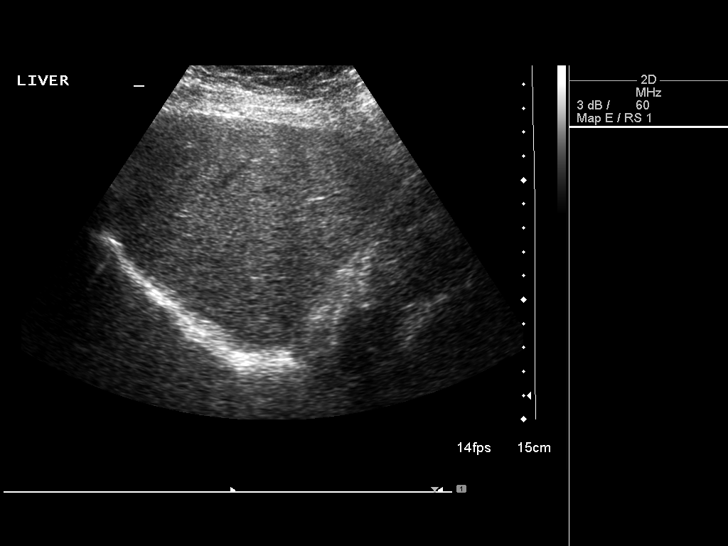
[im 6/66]
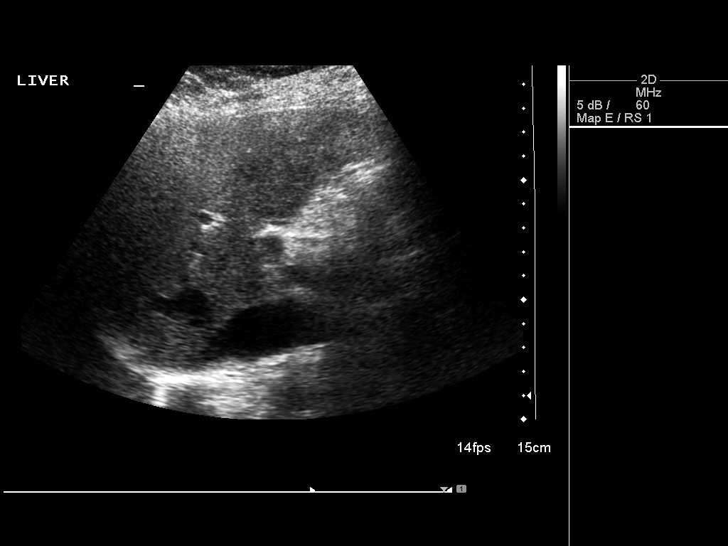
[im 11/66]
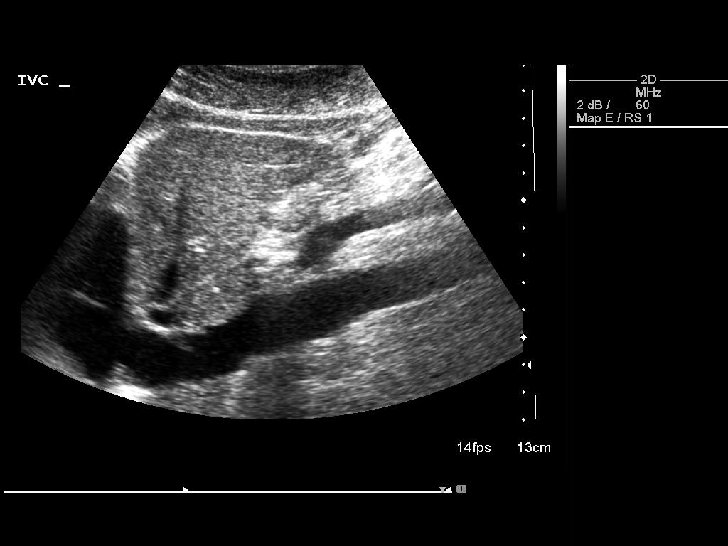
[im 17/66]
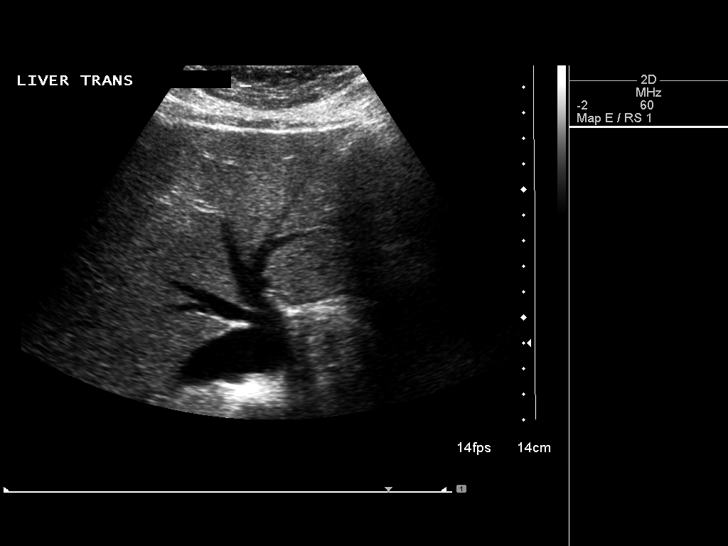
[im 22/66]
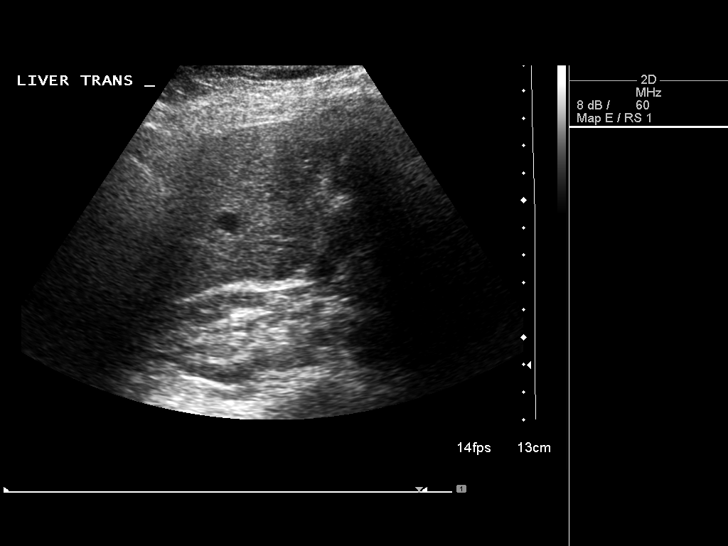
[im 25/66]
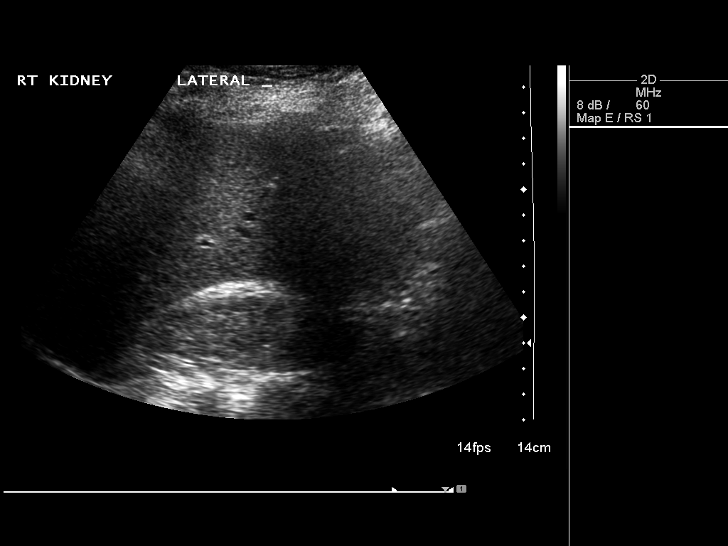
[im 30/66]
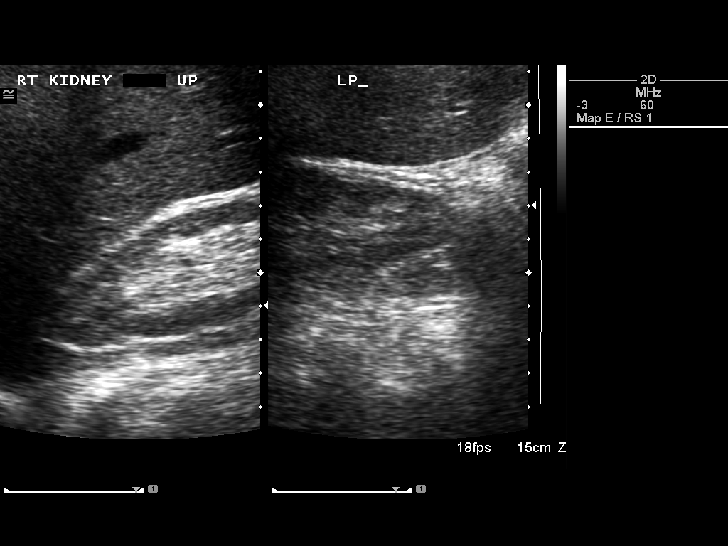
[im 36/66]
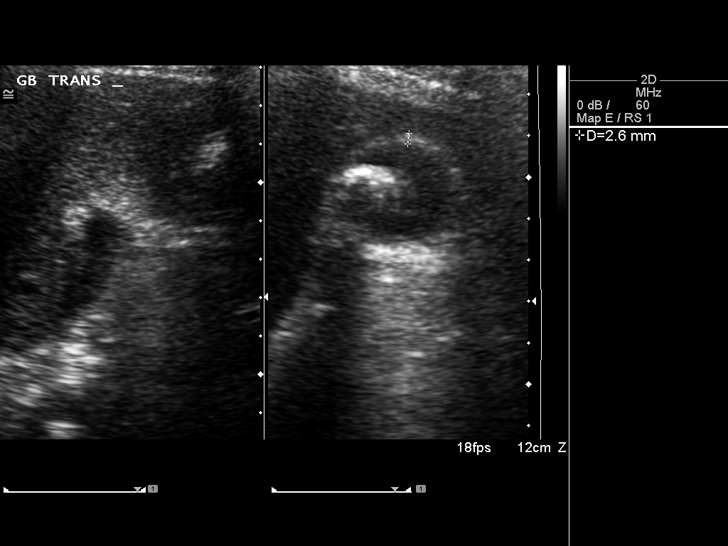
[im 41/66]
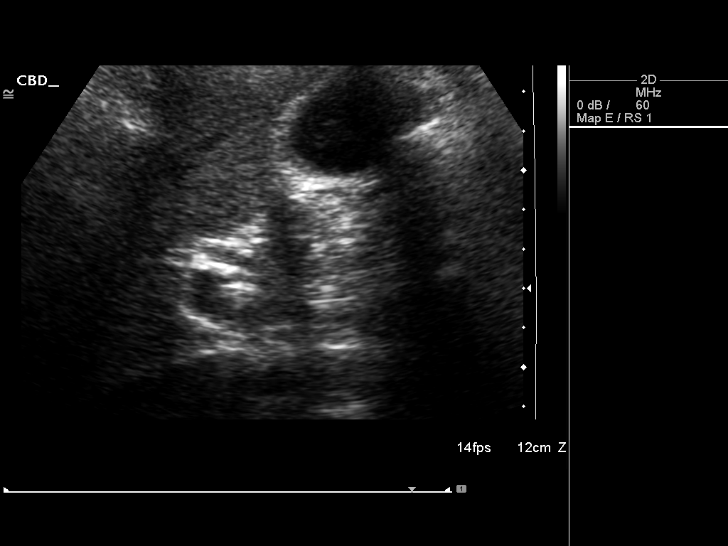
[im 44/66]
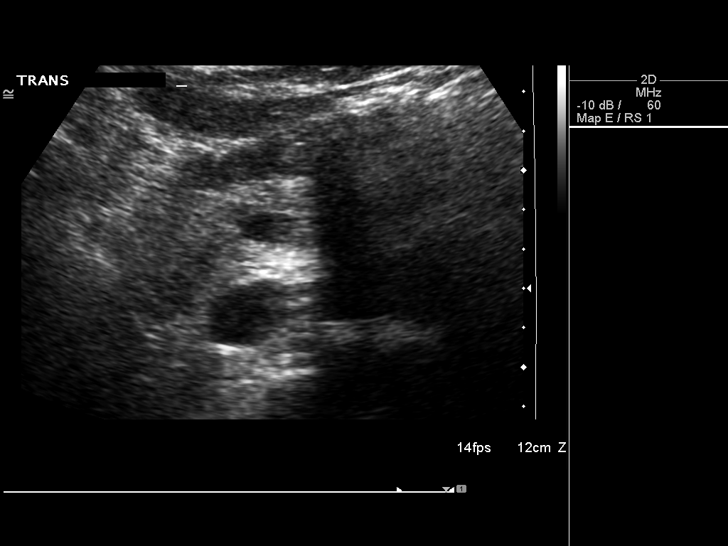
[im 49/66]
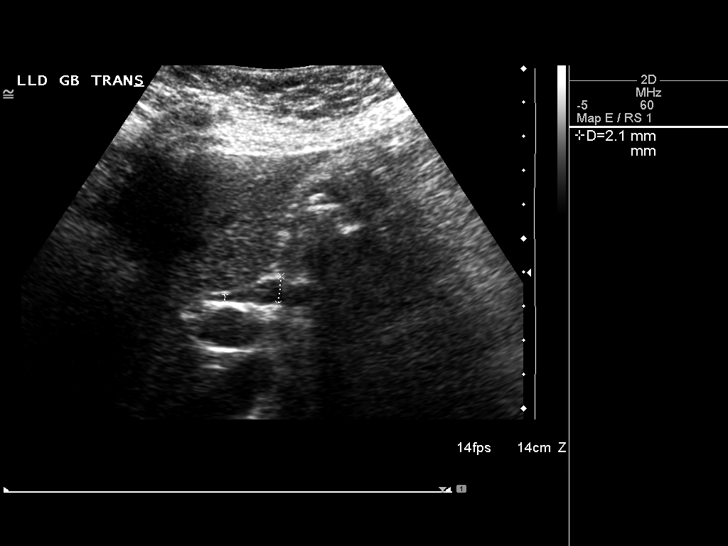
[im 55/66]
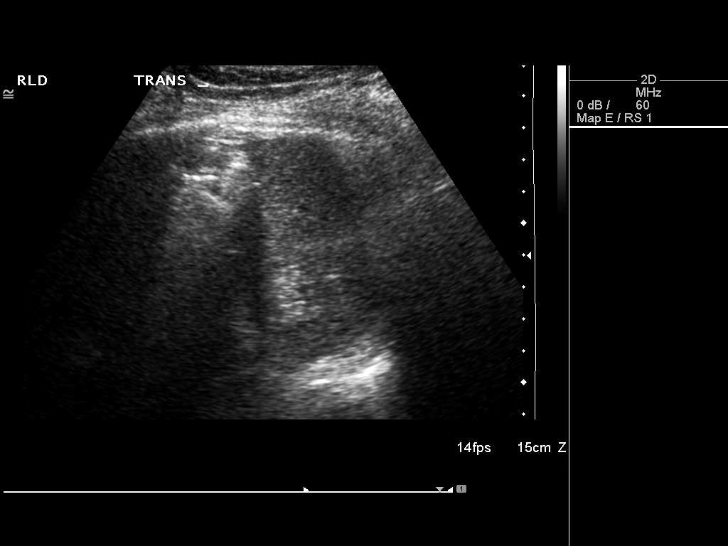
[im 60/66]
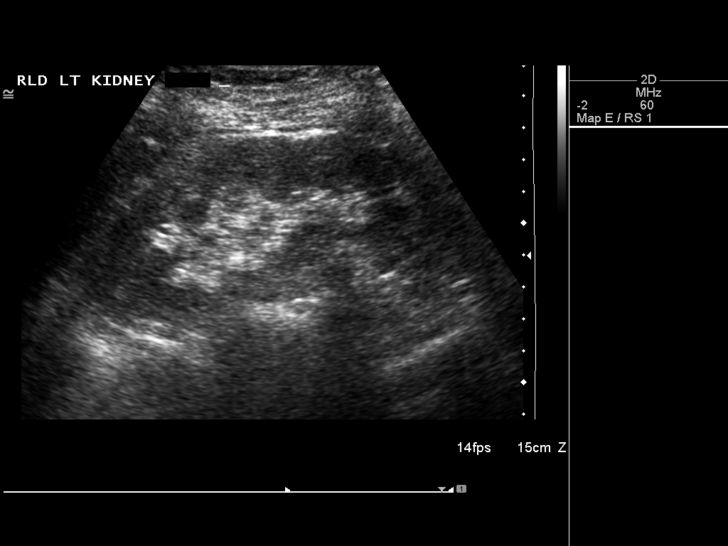
[im 66/66]
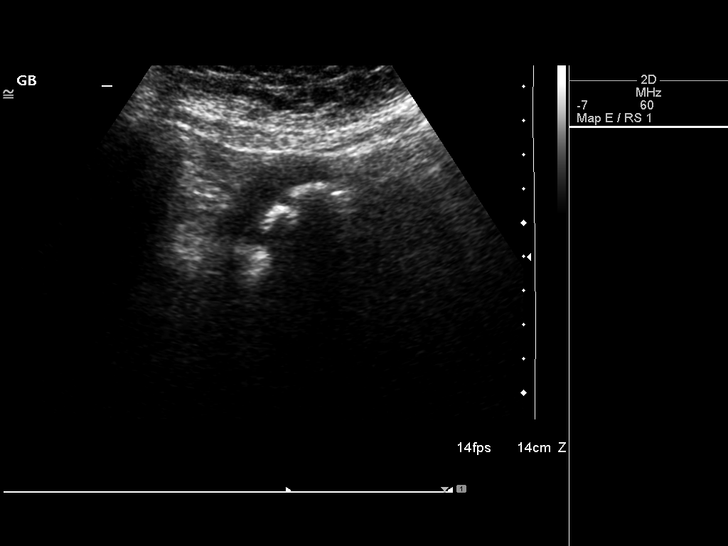

[14 of 25 positions shown; findings below may reference images not displayed]

FINDINGS: Gallbladder:  2.6 cm gallstone within the gallbladder.  No wall
thickening.  Negative sonographic [place].

Common bile duct:   Normal caliber, 4-8 mm in diameter.

Liver:  No focal lesion identified.  Within normal limits in
parenchymal echogenicity.

IVC:  Appears normal.

Pancreas:  No focal abnormality seen.

Spleen:  Within normal limits in size and echotexture.

Right Kidney:   Normal in size and parenchymal echogenicity.  No
evidence of mass or hydronephrosis.

Left Kidney:  Normal in size and parenchymal echogenicity.  No
evidence of mass or hydronephrosis.

Abdominal aorta:  No aneurysm identified.
IMPRESSION: Cholelithiasis.  No sonographic evidence of acute cholecystitis.

## 2013-12-14 DIAGNOSIS — E039 Hypothyroidism, unspecified: Secondary | ICD-10-CM | POA: Diagnosis not present

## 2013-12-14 DIAGNOSIS — R197 Diarrhea, unspecified: Secondary | ICD-10-CM | POA: Diagnosis not present

## 2013-12-14 DIAGNOSIS — F0390 Unspecified dementia without behavioral disturbance: Secondary | ICD-10-CM | POA: Diagnosis not present

## 2014-01-12 DIAGNOSIS — E538 Deficiency of other specified B group vitamins: Secondary | ICD-10-CM | POA: Diagnosis not present

## 2014-01-12 DIAGNOSIS — Z23 Encounter for immunization: Secondary | ICD-10-CM | POA: Diagnosis not present

## 2014-01-12 DIAGNOSIS — R7309 Other abnormal glucose: Secondary | ICD-10-CM | POA: Diagnosis not present

## 2014-01-12 DIAGNOSIS — E039 Hypothyroidism, unspecified: Secondary | ICD-10-CM | POA: Diagnosis not present

## 2014-01-12 DIAGNOSIS — E7800 Pure hypercholesterolemia, unspecified: Secondary | ICD-10-CM | POA: Diagnosis not present

## 2014-03-16 ENCOUNTER — Encounter: Payer: Self-pay | Admitting: Neurology

## 2014-03-22 ENCOUNTER — Encounter: Payer: Self-pay | Admitting: Neurology

## 2014-05-12 DIAGNOSIS — E538 Deficiency of other specified B group vitamins: Secondary | ICD-10-CM | POA: Diagnosis not present

## 2014-05-12 DIAGNOSIS — R7309 Other abnormal glucose: Secondary | ICD-10-CM | POA: Diagnosis not present

## 2014-05-12 DIAGNOSIS — E780 Pure hypercholesterolemia: Secondary | ICD-10-CM | POA: Diagnosis not present

## 2014-05-12 DIAGNOSIS — E039 Hypothyroidism, unspecified: Secondary | ICD-10-CM | POA: Diagnosis not present

## 2014-05-17 DIAGNOSIS — R21 Rash and other nonspecific skin eruption: Secondary | ICD-10-CM | POA: Diagnosis not present

## 2014-05-17 DIAGNOSIS — E039 Hypothyroidism, unspecified: Secondary | ICD-10-CM | POA: Diagnosis not present

## 2014-05-17 DIAGNOSIS — F0390 Unspecified dementia without behavioral disturbance: Secondary | ICD-10-CM | POA: Diagnosis not present

## 2014-05-17 DIAGNOSIS — E780 Pure hypercholesterolemia: Secondary | ICD-10-CM | POA: Diagnosis not present

## 2014-06-15 DIAGNOSIS — R21 Rash and other nonspecific skin eruption: Secondary | ICD-10-CM | POA: Diagnosis not present

## 2014-06-15 DIAGNOSIS — L299 Pruritus, unspecified: Secondary | ICD-10-CM | POA: Diagnosis not present

## 2014-06-16 DIAGNOSIS — L309 Dermatitis, unspecified: Secondary | ICD-10-CM | POA: Diagnosis not present

## 2014-07-18 DIAGNOSIS — L309 Dermatitis, unspecified: Secondary | ICD-10-CM | POA: Diagnosis not present

## 2014-07-29 DIAGNOSIS — H3531 Nonexudative age-related macular degeneration: Secondary | ICD-10-CM | POA: Diagnosis not present

## 2014-08-10 DIAGNOSIS — R197 Diarrhea, unspecified: Secondary | ICD-10-CM | POA: Diagnosis not present

## 2014-08-10 DIAGNOSIS — R531 Weakness: Secondary | ICD-10-CM | POA: Diagnosis not present

## 2014-08-10 DIAGNOSIS — R112 Nausea with vomiting, unspecified: Secondary | ICD-10-CM | POA: Diagnosis not present

## 2014-09-15 DIAGNOSIS — Z23 Encounter for immunization: Secondary | ICD-10-CM | POA: Diagnosis not present

## 2014-11-29 DIAGNOSIS — E039 Hypothyroidism, unspecified: Secondary | ICD-10-CM | POA: Diagnosis not present

## 2014-11-29 DIAGNOSIS — Z79899 Other long term (current) drug therapy: Secondary | ICD-10-CM | POA: Diagnosis not present

## 2014-11-29 DIAGNOSIS — E559 Vitamin D deficiency, unspecified: Secondary | ICD-10-CM | POA: Diagnosis not present

## 2014-11-29 DIAGNOSIS — E538 Deficiency of other specified B group vitamins: Secondary | ICD-10-CM | POA: Diagnosis not present

## 2014-11-29 DIAGNOSIS — M25511 Pain in right shoulder: Secondary | ICD-10-CM | POA: Diagnosis not present

## 2014-11-29 DIAGNOSIS — E780 Pure hypercholesterolemia: Secondary | ICD-10-CM | POA: Diagnosis not present

## 2014-11-29 DIAGNOSIS — R739 Hyperglycemia, unspecified: Secondary | ICD-10-CM | POA: Diagnosis not present

## 2014-11-29 DIAGNOSIS — R609 Edema, unspecified: Secondary | ICD-10-CM | POA: Diagnosis not present

## 2014-11-29 DIAGNOSIS — M25519 Pain in unspecified shoulder: Secondary | ICD-10-CM | POA: Diagnosis not present

## 2014-12-27 DIAGNOSIS — R739 Hyperglycemia, unspecified: Secondary | ICD-10-CM | POA: Diagnosis not present

## 2014-12-27 DIAGNOSIS — Z23 Encounter for immunization: Secondary | ICD-10-CM | POA: Diagnosis not present

## 2014-12-27 DIAGNOSIS — E039 Hypothyroidism, unspecified: Secondary | ICD-10-CM | POA: Diagnosis not present

## 2014-12-27 DIAGNOSIS — R413 Other amnesia: Secondary | ICD-10-CM | POA: Diagnosis not present

## 2014-12-27 DIAGNOSIS — E780 Pure hypercholesterolemia: Secondary | ICD-10-CM | POA: Diagnosis not present

## 2015-01-03 DIAGNOSIS — R600 Localized edema: Secondary | ICD-10-CM | POA: Diagnosis not present

## 2015-03-29 DIAGNOSIS — E538 Deficiency of other specified B group vitamins: Secondary | ICD-10-CM | POA: Diagnosis not present

## 2015-03-29 DIAGNOSIS — E559 Vitamin D deficiency, unspecified: Secondary | ICD-10-CM | POA: Diagnosis not present

## 2015-03-29 DIAGNOSIS — Z79899 Other long term (current) drug therapy: Secondary | ICD-10-CM | POA: Diagnosis not present

## 2015-03-29 DIAGNOSIS — R739 Hyperglycemia, unspecified: Secondary | ICD-10-CM | POA: Diagnosis not present

## 2015-03-29 DIAGNOSIS — E039 Hypothyroidism, unspecified: Secondary | ICD-10-CM | POA: Diagnosis not present

## 2015-03-29 DIAGNOSIS — E7800 Pure hypercholesterolemia, unspecified: Secondary | ICD-10-CM | POA: Diagnosis not present

## 2015-04-06 DIAGNOSIS — E039 Hypothyroidism, unspecified: Secondary | ICD-10-CM | POA: Diagnosis not present

## 2015-04-06 DIAGNOSIS — E7800 Pure hypercholesterolemia, unspecified: Secondary | ICD-10-CM | POA: Diagnosis not present

## 2015-04-06 DIAGNOSIS — F0390 Unspecified dementia without behavioral disturbance: Secondary | ICD-10-CM | POA: Diagnosis not present

## 2015-04-27 ENCOUNTER — Emergency Department (HOSPITAL_COMMUNITY)
Admission: EM | Admit: 2015-04-27 | Discharge: 2015-04-27 | Disposition: A | Discharge status: Home / Self Care | Payer: Medicare Other | Attending: Emergency Medicine | Admitting: Emergency Medicine

## 2015-04-27 ENCOUNTER — Encounter (HOSPITAL_COMMUNITY): Payer: Self-pay | Admitting: Emergency Medicine

## 2015-04-27 ENCOUNTER — Emergency Department (HOSPITAL_COMMUNITY): Payer: Medicare Other

## 2015-04-27 DIAGNOSIS — F419 Anxiety disorder, unspecified: Secondary | ICD-10-CM | POA: Diagnosis not present

## 2015-04-27 DIAGNOSIS — R112 Nausea with vomiting, unspecified: Secondary | ICD-10-CM | POA: Insufficient documentation

## 2015-04-27 DIAGNOSIS — K8020 Calculus of gallbladder without cholecystitis without obstruction: Secondary | ICD-10-CM | POA: Diagnosis not present

## 2015-04-27 DIAGNOSIS — R911 Solitary pulmonary nodule: Secondary | ICD-10-CM | POA: Insufficient documentation

## 2015-04-27 DIAGNOSIS — M1990 Unspecified osteoarthritis, unspecified site: Secondary | ICD-10-CM | POA: Diagnosis not present

## 2015-04-27 DIAGNOSIS — R1031 Right lower quadrant pain: Secondary | ICD-10-CM | POA: Diagnosis not present

## 2015-04-27 DIAGNOSIS — F329 Major depressive disorder, single episode, unspecified: Secondary | ICD-10-CM | POA: Diagnosis not present

## 2015-04-27 DIAGNOSIS — R197 Diarrhea, unspecified: Secondary | ICD-10-CM | POA: Insufficient documentation

## 2015-04-27 DIAGNOSIS — Z8669 Personal history of other diseases of the nervous system and sense organs: Secondary | ICD-10-CM | POA: Insufficient documentation

## 2015-04-27 DIAGNOSIS — K862 Cyst of pancreas: Secondary | ICD-10-CM | POA: Insufficient documentation

## 2015-04-27 DIAGNOSIS — K5730 Diverticulosis of large intestine without perforation or abscess without bleeding: Secondary | ICD-10-CM | POA: Diagnosis not present

## 2015-04-27 DIAGNOSIS — Z79899 Other long term (current) drug therapy: Secondary | ICD-10-CM | POA: Diagnosis not present

## 2015-04-27 DIAGNOSIS — E039 Hypothyroidism, unspecified: Secondary | ICD-10-CM | POA: Diagnosis not present

## 2015-04-27 DIAGNOSIS — Z8551 Personal history of malignant neoplasm of bladder: Secondary | ICD-10-CM | POA: Diagnosis not present

## 2015-04-27 LAB — COMPREHENSIVE METABOLIC PANEL
ALT: 12 U/L — ABNORMAL LOW (ref 14–54)
AST: 19 U/L (ref 15–41)
Albumin: 4.5 g/dL (ref 3.5–5.0)
Alkaline Phosphatase: 52 U/L (ref 38–126)
Anion gap: 9 (ref 5–15)
BUN: 17 mg/dL (ref 6–20)
CO2: 29 mmol/L (ref 22–32)
Calcium: 9.5 mg/dL (ref 8.9–10.3)
Chloride: 104 mmol/L (ref 101–111)
Creatinine, Ser: 0.74 mg/dL (ref 0.44–1.00)
GFR calc Af Amer: >60 mL/min (ref >60)
GFR calc non Af Amer: >60 mL/min (ref >60)
Glucose, Bld: 100 mg/dL — ABNORMAL HIGH (ref 65–99)
Potassium: 3.7 mmol/L (ref 3.5–5.1)
Sodium: 142 mmol/L (ref 135–145)
Total Bilirubin: 1 mg/dL (ref 0.3–1.2)
Total Protein: 7.3 g/dL (ref 6.5–8.1)

## 2015-04-27 LAB — URINALYSIS, ROUTINE W REFLEX MICROSCOPIC
Bilirubin Urine: NEGATIVE
Glucose, UA: NEGATIVE mg/dL
Ketones, ur: 40 mg/dL — AB
Leukocytes, UA: NEGATIVE
Nitrite: NEGATIVE
Protein, ur: NEGATIVE mg/dL
Specific Gravity, Urine: 1.021 (ref 1.005–1.030)
pH: 6 (ref 5.0–8.0)

## 2015-04-27 LAB — CBC
HCT: 42.3 % (ref 36.0–46.0)
Hemoglobin: 13.8 g/dL (ref 12.0–15.0)
MCH: 31.6 pg (ref 26.0–34.0)
MCHC: 32.6 g/dL (ref 30.0–36.0)
MCV: 96.8 fL (ref 78.0–100.0)
Platelets: 276 10*3/uL (ref 150–400)
RBC: 4.37 MIL/uL (ref 3.87–5.11)
RDW: 12.2 % (ref 11.5–15.5)
WBC: 6.2 10*3/uL (ref 4.0–10.5)

## 2015-04-27 LAB — URINE MICROSCOPIC-ADD ON

## 2015-04-27 LAB — LIPASE, BLOOD: Lipase: 46 U/L (ref 11–51)

## 2015-04-27 LAB — TSH: TSH: 2.776 u[IU]/mL (ref 0.350–4.500)

## 2015-04-27 MED ORDER — IOHEXOL 300 MG/ML  SOLN
100.0000 mL | Freq: Once | INTRAMUSCULAR | Status: AC | PRN
  Administered 2015-04-27: 100 mL via INTRAVENOUS

## 2015-04-27 MED ORDER — SODIUM CHLORIDE 0.9 % IV BOLUS (SEPSIS)
1000.0000 mL | Freq: Once | INTRAVENOUS | Status: AC
  Administered 2015-04-27: 1000 mL via INTRAVENOUS

## 2015-04-27 MED ORDER — ONDANSETRON HCL 4 MG/2ML IJ SOLN
4.0000 mg | Freq: Once | INTRAMUSCULAR | Status: AC
  Administered 2015-04-27: 4 mg via INTRAVENOUS
  Filled 2015-04-27: qty 2

## 2015-04-27 MED ORDER — ONDANSETRON HCL 4 MG PO TABS: 4.0000 mg | ORAL_TABLET | Freq: Three times a day (TID) | ORAL | Status: DC | PRN

## 2015-04-27 NOTE — ED Notes (Signed)
Patient transported to CT 

## 2015-04-27 NOTE — ED Notes (Signed)
Patient not able to stool sample

## 2015-04-27 NOTE — ED Notes (Signed)
Pt c/o emesis, nausea, diarrhea, dehydration x several weeks. Lost 20 lbs in 3 weeks. No pain.
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
## 2015-04-27 NOTE — Discharge Instructions (Signed)
Please return without fail for worsening symptoms including worsening pain, fever, vomiting unable to keep down food or fluids despite nausea medications, bloody stools, or any other symptoms concerning to you.  Diarrhea Diarrhea is frequent loose and watery bowel movements. It can cause you to feel weak and dehydrated. Dehydration can cause you to become tired and thirsty, have a dry mouth, and have decreased urination that often is dark yellow. Diarrhea is a sign of another problem, most often an infection that will not last long. In most cases, diarrhea typically lasts 2-3 days. However, it can last longer if it is a sign of something more serious. It is important to treat your diarrhea as directed by your caregiver to lessen or prevent future episodes of diarrhea. CAUSES  Some common causes include:  Gastrointestinal infections caused by viruses, bacteria, or parasites.  Food poisoning or food allergies.  Certain medicines, such as antibiotics, chemotherapy, and laxatives.  Artificial sweeteners and fructose.  Digestive disorders. HOME CARE INSTRUCTIONS  Ensure adequate fluid intake (hydration): Have 1 cup (8 oz) of fluid for each diarrhea episode. Avoid fluids that contain simple sugars or sports drinks, fruit juices, whole milk products, and sodas. Your urine should be clear or pale yellow if you are drinking enough fluids. Hydrate with an oral rehydration solution that you can purchase at pharmacies, retail stores, and online. You can prepare an oral rehydration solution at home by mixing the following ingredients together:   - tsp table salt.   tsp baking soda.   tsp salt substitute containing potassium chloride.  1  tablespoons sugar.  1 L (34 oz) of water.  Certain foods and beverages may increase the speed at which food moves through the gastrointestinal (GI) tract. These foods and beverages should be avoided and include:  Caffeinated and alcoholic beverages.  High-fiber  foods, such as raw fruits and vegetables, nuts, seeds, and whole grain breads and cereals.  Foods and beverages sweetened with sugar alcohols, such as xylitol, sorbitol, and mannitol.  Some foods may be well tolerated and may help thicken stool including:  Starchy foods, such as rice, toast, pasta, low-sugar cereal, oatmeal, grits, baked potatoes, crackers, and bagels.  Bananas.  Applesauce.  Add probiotic-rich foods to help increase healthy bacteria in the GI tract, such as yogurt and fermented milk products.  Wash your hands well after each diarrhea episode.  Only take over-the-counter or prescription medicines as directed by your caregiver.  Take a warm bath to relieve any burning or pain from frequent diarrhea episodes. SEEK IMMEDIATE MEDICAL CARE IF:   You are unable to keep fluids down.  You have persistent vomiting.  You have blood in your stool, or your stools are black and tarry.  You do not urinate in 6-8 hours, or there is only a small amount of very dark urine.  You have abdominal pain that increases or localizes.  You have weakness, dizziness, confusion, or light-headedness.  You have a severe headache.  Your diarrhea gets worse or does not get better.  You have a fever or persistent symptoms for more than 2-3 days.  You have a fever and your symptoms suddenly get worse. MAKE SURE YOU:   Understand these instructions.  Will watch your condition.  Will get help right away if you are not doing well or get worse.   This information is not intended to replace advice given to you by your health care provider. Make sure you discuss any questions you have with your health  care provider.   Document Released: 04/05/2002 Document Revised: 05/06/2014 Document Reviewed: 12/22/2011 Elsevier Interactive Patient Education 2016 [employer]  Nausea and Vomiting Nausea is a sick feeling that often comes before throwing up (vomiting). Vomiting is a reflex where  stomach contents come out of your mouth. Vomiting can cause severe loss of body fluids (dehydration). Children and elderly adults can become dehydrated quickly, especially if they also have diarrhea. Nausea and vomiting are symptoms of a condition or disease. It is important to find the cause of your symptoms. CAUSES   Direct irritation of the stomach lining. This irritation can result from increased acid production (gastroesophageal reflux disease), infection, food poisoning, taking certain medicines (such as nonsteroidal anti-inflammatory drugs), alcohol use, or tobacco use.  Signals from the brain.These signals could be caused by a headache, heat exposure, an inner ear disturbance, increased pressure in the brain from injury, infection, a tumor, or a concussion, pain, emotional stimulus, or metabolic problems.  An obstruction in the gastrointestinal tract (bowel obstruction).  Illnesses such as diabetes, hepatitis, gallbladder problems, appendicitis, kidney problems, cancer, sepsis, atypical symptoms of a heart attack, or eating disorders.  Medical treatments such as chemotherapy and radiation.  Receiving medicine that makes you sleep (general anesthetic) during surgery. DIAGNOSIS Your caregiver may ask for tests to be done if the problems do not improve after a few days. Tests may also be done if symptoms are severe or if the reason for the nausea and vomiting is not clear. Tests may include:  Urine tests.  Blood tests.  Stool tests.  Cultures (to look for evidence of infection).  X-rays or other imaging studies. Test results can help your caregiver make decisions about treatment or the need for additional tests. TREATMENT You need to stay well hydrated. Drink frequently but in small amounts.You may wish to drink water, sports drinks, clear broth, or eat frozen ice pops or gelatin dessert to help stay hydrated.When you eat, eating slowly may help prevent nausea.There are also some  antinausea medicines that may help prevent nausea. HOME CARE INSTRUCTIONS   Take all medicine as directed by your caregiver.  If you do not have an appetite, do not force yourself to eat. However, you must continue to drink fluids.  If you have an appetite, eat a normal diet unless your caregiver tells you differently.  Eat a variety of complex carbohydrates (rice, wheat, potatoes, bread), lean meats, yogurt, fruits, and vegetables.  Avoid high-fat foods because they are more difficult to digest.  Drink enough water and fluids to keep your urine clear or pale yellow.  If you are dehydrated, ask your caregiver for specific rehydration instructions. Signs of dehydration may include:  Severe thirst.  Dry lips and mouth.  Dizziness.  Dark urine.  Decreasing urine frequency and amount.  Confusion.  Rapid breathing or pulse. SEEK IMMEDIATE MEDICAL CARE IF:   You have blood or brown flecks (like coffee grounds) in your vomit.  You have black or bloody stools.  You have a severe headache or stiff neck.  You are confused.  You have severe abdominal pain.  You have chest pain or trouble breathing.  You do not urinate at least once every 8 hours.  You develop cold or clammy skin.  You continue to vomit for longer than 24 to 48 hours.  You have a fever. MAKE SURE YOU:   Understand these instructions.  Will watch your condition.  Will get help right away if you are not doing well or  get worse.   This information is not intended to replace advice given to you by your health care provider. Make sure you discuss any questions you have with your health care provider.   Document Released: 04/15/2005 Document Revised: 07/08/2011 Document Reviewed: 09/12/2010 Elsevier Interactive Patient Education [employer].

## 2015-05-02 DIAGNOSIS — R110 Nausea: Secondary | ICD-10-CM | POA: Diagnosis not present

## 2015-05-02 DIAGNOSIS — F0390 Unspecified dementia without behavioral disturbance: Secondary | ICD-10-CM | POA: Diagnosis not present

## 2015-05-16 DIAGNOSIS — Z1389 Encounter for screening for other disorder: Secondary | ICD-10-CM | POA: Diagnosis not present

## 2015-05-16 DIAGNOSIS — R739 Hyperglycemia, unspecified: Secondary | ICD-10-CM | POA: Diagnosis not present

## 2015-05-16 DIAGNOSIS — E7800 Pure hypercholesterolemia, unspecified: Secondary | ICD-10-CM | POA: Diagnosis not present

## 2015-06-02 DIAGNOSIS — Z23 Encounter for immunization: Secondary | ICD-10-CM | POA: Diagnosis not present

## 2015-07-12 DIAGNOSIS — I8312 Varicose veins of left lower extremity with inflammation: Secondary | ICD-10-CM | POA: Diagnosis not present

## 2015-07-12 DIAGNOSIS — R600 Localized edema: Secondary | ICD-10-CM | POA: Diagnosis not present

## 2015-07-12 DIAGNOSIS — I8311 Varicose veins of right lower extremity with inflammation: Secondary | ICD-10-CM | POA: Diagnosis not present

## 2015-07-13 DIAGNOSIS — L309 Dermatitis, unspecified: Secondary | ICD-10-CM | POA: Diagnosis not present

## 2015-08-06 ENCOUNTER — Emergency Department (HOSPITAL_COMMUNITY)
Admission: EM | Admit: 2015-08-06 | Discharge: 2015-08-06 | Disposition: A | Discharge status: Home / Self Care | Payer: Medicare Other | Attending: Emergency Medicine | Admitting: Emergency Medicine

## 2015-08-06 ENCOUNTER — Encounter (HOSPITAL_COMMUNITY): Payer: Self-pay | Admitting: Emergency Medicine

## 2015-08-06 DIAGNOSIS — E039 Hypothyroidism, unspecified: Secondary | ICD-10-CM | POA: Diagnosis not present

## 2015-08-06 DIAGNOSIS — Z8719 Personal history of other diseases of the digestive system: Secondary | ICD-10-CM | POA: Insufficient documentation

## 2015-08-06 DIAGNOSIS — H00013 Hordeolum externum right eye, unspecified eyelid: Secondary | ICD-10-CM | POA: Diagnosis not present

## 2015-08-06 DIAGNOSIS — L0390 Cellulitis, unspecified: Secondary | ICD-10-CM

## 2015-08-06 DIAGNOSIS — L03311 Cellulitis of abdominal wall: Secondary | ICD-10-CM | POA: Insufficient documentation

## 2015-08-06 DIAGNOSIS — L02211 Cutaneous abscess of abdominal wall: Secondary | ICD-10-CM | POA: Diagnosis not present

## 2015-08-06 DIAGNOSIS — M1990 Unspecified osteoarthritis, unspecified site: Secondary | ICD-10-CM | POA: Insufficient documentation

## 2015-08-06 DIAGNOSIS — E785 Hyperlipidemia, unspecified: Secondary | ICD-10-CM | POA: Insufficient documentation

## 2015-08-06 DIAGNOSIS — L0291 Cutaneous abscess, unspecified: Secondary | ICD-10-CM

## 2015-08-06 DIAGNOSIS — H00012 Hordeolum externum right lower eyelid: Secondary | ICD-10-CM | POA: Insufficient documentation

## 2015-08-06 DIAGNOSIS — F329 Major depressive disorder, single episode, unspecified: Secondary | ICD-10-CM | POA: Diagnosis not present

## 2015-08-06 DIAGNOSIS — Z79899 Other long term (current) drug therapy: Secondary | ICD-10-CM | POA: Insufficient documentation

## 2015-08-06 DIAGNOSIS — Z86018 Personal history of other benign neoplasm: Secondary | ICD-10-CM | POA: Insufficient documentation

## 2015-08-06 MED ORDER — CLINDAMYCIN HCL 300 MG PO CAPS
450.0000 mg | ORAL_CAPSULE | Freq: Once | ORAL | Status: AC
  Administered 2015-08-06: 450 mg via ORAL
  Filled 2015-08-06: qty 1

## 2015-08-06 MED ORDER — CLINDAMYCIN HCL 150 MG PO CAPS: 450.0000 mg | ORAL_CAPSULE | Freq: Four times a day (QID) | ORAL | Status: DC

## 2015-08-06 MED ORDER — OXYCODONE HCL 5 MG PO TABS
2.5000 mg | ORAL_TABLET | Freq: Once | ORAL | Status: AC
Start: 2015-08-06 — End: 2015-08-06
  Administered 2015-08-06: 2.5 mg via ORAL
  Filled 2015-08-06: qty 1

## 2015-08-06 MED ORDER — POLYMYXIN B-TRIMETHOPRIM 10000-0.1 UNIT/ML-% OP SOLN: 1.0000 [drp] | Freq: Four times a day (QID) | OPHTHALMIC | Status: DC

## 2015-08-06 MED ORDER — LIDOCAINE-EPINEPHRINE 1 %-1:100000 IJ SOLN
20.0000 mL | Freq: Once | INTRAMUSCULAR | Status: AC
  Administered 2015-08-06: 20 mL via INTRADERMAL
  Filled 2015-08-06: qty 1

## 2015-08-06 MED ORDER — ACETAMINOPHEN 500 MG PO TABS
1000.0000 mg | ORAL_TABLET | Freq: Once | ORAL | Status: AC
  Administered 2015-08-06: 1000 mg via ORAL
  Filled 2015-08-06: qty 2

## 2015-08-06 MED ORDER — IBUPROFEN 800 MG PO TABS
800.0000 mg | ORAL_TABLET | Freq: Once | ORAL | Status: AC
  Administered 2015-08-06: 800 mg via ORAL
  Filled 2015-08-06: qty 1

## 2015-08-06 NOTE — ED Notes (Signed)
Pt states that approx. 6 days ago she started having a raised area on his RUQ of her abdomen. Area is now red, swollen and spreading. Alert and oriented per norm.

## 2015-08-06 NOTE — ED Notes (Signed)
Awake. Verbally responsive. A/O x4. Resp even and unlabored. No audible adventitious breath sounds noted. ABC's intact.  
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
## 2015-08-06 NOTE — Discharge Instructions (Signed)
Warm compresses 4 times a day. Do the same with your stye. Follow up tomorrow with your PCP.  Abscess An abscess is an infected area that contains a collection of pus and debris.It can occur in almost any part of the body. An abscess is also known as a furuncle or boil. CAUSES  An abscess occurs when tissue gets infected. This can occur from blockage of oil or sweat glands, infection of hair follicles, or a minor injury to the skin. As the body tries to fight the infection, pus collects in the area and creates pressure under the skin. This pressure causes pain. People with weakened immune systems have difficulty fighting infections and get certain abscesses more often.  SYMPTOMS Usually an abscess develops on the skin and becomes a painful mass that is red, warm, and tender. If the abscess forms under the skin, you may feel a moveable soft area under the skin. Some abscesses break open (rupture) on their own, but most will continue to get worse without care. The infection can spread deeper into the body and eventually into the bloodstream, causing you to feel ill.  DIAGNOSIS  Your caregiver will take your medical history and perform a physical exam. A sample of fluid may also be taken from the abscess to determine what is causing your infection. TREATMENT  Your caregiver may prescribe antibiotic medicines to fight the infection. However, taking antibiotics alone usually does not cure an abscess. Your caregiver may need to make a small cut (incision) in the abscess to drain the pus. In some cases, gauze is packed into the abscess to reduce pain and to continue draining the area. HOME CARE INSTRUCTIONS   Only take over-the-counter or prescription medicines for pain, discomfort, or fever as directed by your caregiver.  If you were prescribed antibiotics, take them as directed. Finish them even if you start to feel better.  If gauze is used, follow your caregiver's directions for changing the  gauze.  To avoid spreading the infection:  Keep your draining abscess covered with a bandage.  Wash your hands well.  Do not share personal care items, towels, or whirlpools with others.  Avoid skin contact with others.  Keep your skin and clothes clean around the abscess.  Keep all follow-up appointments as directed by your caregiver. SEEK MEDICAL CARE IF:   You have increased pain, swelling, redness, fluid drainage, or bleeding.  You have muscle aches, chills, or a general ill feeling.  You have a fever. MAKE SURE YOU:   Understand these instructions.  Will watch your condition.  Will get help right away if you are not doing well or get worse.   This information is not intended to replace advice given to you by your health care provider. Make sure you discuss any questions you have with your health care provider.   Document Released: 01/23/2005 Document Revised: 10/15/2011 Document Reviewed: 06/28/2011 Elsevier Interactive Patient Education [employer].

## 2015-08-07 DIAGNOSIS — L0390 Cellulitis, unspecified: Secondary | ICD-10-CM | POA: Diagnosis not present

## 2015-08-09 DIAGNOSIS — S31109A Unspecified open wound of abdominal wall, unspecified quadrant without penetration into peritoneal cavity, initial encounter: Secondary | ICD-10-CM | POA: Diagnosis not present

## 2015-08-10 DIAGNOSIS — S31109A Unspecified open wound of abdominal wall, unspecified quadrant without penetration into peritoneal cavity, initial encounter: Secondary | ICD-10-CM | POA: Diagnosis not present

## 2015-08-15 DIAGNOSIS — E7800 Pure hypercholesterolemia, unspecified: Secondary | ICD-10-CM | POA: Diagnosis not present

## 2015-08-15 DIAGNOSIS — L709 Acne, unspecified: Secondary | ICD-10-CM | POA: Diagnosis not present

## 2015-08-15 DIAGNOSIS — R739 Hyperglycemia, unspecified: Secondary | ICD-10-CM | POA: Diagnosis not present

## 2015-08-15 DIAGNOSIS — E039 Hypothyroidism, unspecified: Secondary | ICD-10-CM | POA: Diagnosis not present

## 2015-09-12 DIAGNOSIS — I8311 Varicose veins of right lower extremity with inflammation: Secondary | ICD-10-CM | POA: Diagnosis not present

## 2015-10-04 DIAGNOSIS — I8312 Varicose veins of left lower extremity with inflammation: Secondary | ICD-10-CM | POA: Diagnosis not present

## 2015-10-04 DIAGNOSIS — I8311 Varicose veins of right lower extremity with inflammation: Secondary | ICD-10-CM | POA: Diagnosis not present

## 2015-10-24 DIAGNOSIS — I8311 Varicose veins of right lower extremity with inflammation: Secondary | ICD-10-CM | POA: Diagnosis not present

## 2015-11-20 DIAGNOSIS — E7800 Pure hypercholesterolemia, unspecified: Secondary | ICD-10-CM | POA: Diagnosis not present

## 2015-11-20 DIAGNOSIS — Z131 Encounter for screening for diabetes mellitus: Secondary | ICD-10-CM | POA: Diagnosis not present

## 2015-11-20 DIAGNOSIS — R739 Hyperglycemia, unspecified: Secondary | ICD-10-CM | POA: Diagnosis not present

## 2015-11-20 DIAGNOSIS — E039 Hypothyroidism, unspecified: Secondary | ICD-10-CM | POA: Diagnosis not present

## 2015-11-24 DIAGNOSIS — L739 Follicular disorder, unspecified: Secondary | ICD-10-CM | POA: Diagnosis not present

## 2015-11-24 DIAGNOSIS — R413 Other amnesia: Secondary | ICD-10-CM | POA: Diagnosis not present

## 2015-11-24 DIAGNOSIS — E7800 Pure hypercholesterolemia, unspecified: Secondary | ICD-10-CM | POA: Diagnosis not present

## 2015-11-24 DIAGNOSIS — R739 Hyperglycemia, unspecified: Secondary | ICD-10-CM | POA: Diagnosis not present

## 2015-12-26 ENCOUNTER — Other Ambulatory Visit: Payer: Self-pay

## 2016-01-02 DIAGNOSIS — I8312 Varicose veins of left lower extremity with inflammation: Secondary | ICD-10-CM | POA: Diagnosis not present

## 2016-02-02 DIAGNOSIS — R600 Localized edema: Secondary | ICD-10-CM | POA: Diagnosis not present

## 2016-02-02 DIAGNOSIS — I8311 Varicose veins of right lower extremity with inflammation: Secondary | ICD-10-CM | POA: Diagnosis not present

## 2016-02-02 DIAGNOSIS — I8312 Varicose veins of left lower extremity with inflammation: Secondary | ICD-10-CM | POA: Diagnosis not present

## 2016-03-19 DIAGNOSIS — Z131 Encounter for screening for diabetes mellitus: Secondary | ICD-10-CM | POA: Diagnosis not present

## 2016-03-19 DIAGNOSIS — E039 Hypothyroidism, unspecified: Secondary | ICD-10-CM | POA: Diagnosis not present

## 2016-03-19 DIAGNOSIS — E7800 Pure hypercholesterolemia, unspecified: Secondary | ICD-10-CM | POA: Diagnosis not present

## 2016-03-19 DIAGNOSIS — R739 Hyperglycemia, unspecified: Secondary | ICD-10-CM | POA: Diagnosis not present

## 2016-03-26 DIAGNOSIS — E039 Hypothyroidism, unspecified: Secondary | ICD-10-CM | POA: Diagnosis not present

## 2016-03-26 DIAGNOSIS — L309 Dermatitis, unspecified: Secondary | ICD-10-CM | POA: Diagnosis not present

## 2016-03-26 DIAGNOSIS — E7800 Pure hypercholesterolemia, unspecified: Secondary | ICD-10-CM | POA: Diagnosis not present

## 2016-03-26 DIAGNOSIS — L719 Rosacea, unspecified: Secondary | ICD-10-CM | POA: Diagnosis not present

## 2016-04-09 ENCOUNTER — Emergency Department (HOSPITAL_COMMUNITY)
Admission: EM | Admit: 2016-04-09 | Discharge: 2016-04-09 | Disposition: A | Discharge status: Home / Self Care | Payer: Medicare Other | Attending: Emergency Medicine | Admitting: Emergency Medicine

## 2016-04-09 ENCOUNTER — Encounter (HOSPITAL_COMMUNITY): Payer: Self-pay | Admitting: Emergency Medicine

## 2016-04-09 ENCOUNTER — Emergency Department (HOSPITAL_COMMUNITY): Payer: Medicare Other

## 2016-04-09 DIAGNOSIS — W010XXA Fall on same level from slipping, tripping and stumbling without subsequent striking against object, initial encounter: Secondary | ICD-10-CM | POA: Diagnosis not present

## 2016-04-09 DIAGNOSIS — R531 Weakness: Secondary | ICD-10-CM | POA: Diagnosis not present

## 2016-04-09 DIAGNOSIS — E039 Hypothyroidism, unspecified: Secondary | ICD-10-CM | POA: Insufficient documentation

## 2016-04-09 DIAGNOSIS — Y999 Unspecified external cause status: Secondary | ICD-10-CM | POA: Diagnosis not present

## 2016-04-09 DIAGNOSIS — M25562 Pain in left knee: Secondary | ICD-10-CM | POA: Insufficient documentation

## 2016-04-09 DIAGNOSIS — S8992XA Unspecified injury of left lower leg, initial encounter: Secondary | ICD-10-CM | POA: Diagnosis not present

## 2016-04-09 DIAGNOSIS — S8991XA Unspecified injury of right lower leg, initial encounter: Secondary | ICD-10-CM | POA: Diagnosis not present

## 2016-04-09 DIAGNOSIS — Y929 Unspecified place or not applicable: Secondary | ICD-10-CM | POA: Diagnosis not present

## 2016-04-09 DIAGNOSIS — Y939 Activity, unspecified: Secondary | ICD-10-CM | POA: Insufficient documentation

## 2016-04-09 DIAGNOSIS — R05 Cough: Secondary | ICD-10-CM | POA: Diagnosis not present

## 2016-04-09 DIAGNOSIS — R031 Nonspecific low blood-pressure reading: Secondary | ICD-10-CM | POA: Diagnosis not present

## 2016-04-09 DIAGNOSIS — W19XXXA Unspecified fall, initial encounter: Secondary | ICD-10-CM

## 2016-04-09 DIAGNOSIS — M25561 Pain in right knee: Secondary | ICD-10-CM | POA: Insufficient documentation

## 2016-04-09 DIAGNOSIS — T1490XA Injury, unspecified, initial encounter: Secondary | ICD-10-CM | POA: Diagnosis not present

## 2016-04-09 LAB — COMPREHENSIVE METABOLIC PANEL
ALT: 12 U/L — ABNORMAL LOW (ref 14–54)
AST: 27 U/L (ref 15–41)
Albumin: 4.2 g/dL (ref 3.5–5.0)
Alkaline Phosphatase: 45 U/L (ref 38–126)
Anion gap: 10 (ref 5–15)
BUN: 31 mg/dL — ABNORMAL HIGH (ref 6–20)
CO2: 25 mmol/L (ref 22–32)
Calcium: 9.3 mg/dL (ref 8.9–10.3)
Chloride: 102 mmol/L (ref 101–111)
Creatinine, Ser: 1.08 mg/dL — ABNORMAL HIGH (ref 0.44–1.00)
GFR calc Af Amer: 52 mL/min — ABNORMAL LOW (ref >60)
GFR calc non Af Amer: 45 mL/min — ABNORMAL LOW (ref >60)
Glucose, Bld: 98 mg/dL (ref 65–99)
Potassium: 3.8 mmol/L (ref 3.5–5.1)
Sodium: 137 mmol/L (ref 135–145)
Total Bilirubin: 1.6 mg/dL — ABNORMAL HIGH (ref 0.3–1.2)
Total Protein: 7.4 g/dL (ref 6.5–8.1)

## 2016-04-09 LAB — URINALYSIS, ROUTINE W REFLEX MICROSCOPIC
Bilirubin Urine: NEGATIVE
Glucose, UA: NEGATIVE mg/dL
Hgb urine dipstick: NEGATIVE
Ketones, ur: 5 mg/dL — AB
Leukocytes, UA: NEGATIVE
Nitrite: NEGATIVE
Protein, ur: NEGATIVE mg/dL
Specific Gravity, Urine: 1.015 (ref 1.005–1.030)
pH: 6 (ref 5.0–8.0)

## 2016-04-09 LAB — CBC WITH DIFFERENTIAL/PLATELET
Basophils Absolute: 0 10*3/uL (ref 0.0–0.1)
Basophils Relative: 0 %
Eosinophils Absolute: 0 10*3/uL (ref 0.0–0.7)
Eosinophils Relative: 0 %
HCT: 39.7 % (ref 36.0–46.0)
Hemoglobin: 13 g/dL (ref 12.0–15.0)
Lymphocytes Relative: 10 %
Lymphs Abs: 1.2 10*3/uL (ref 0.7–4.0)
MCH: 31.6 pg (ref 26.0–34.0)
MCHC: 32.7 g/dL (ref 30.0–36.0)
MCV: 96.6 fL (ref 78.0–100.0)
Monocytes Absolute: 1.2 10*3/uL — ABNORMAL HIGH (ref 0.1–1.0)
Monocytes Relative: 10 %
Neutro Abs: 9.6 10*3/uL — ABNORMAL HIGH (ref 1.7–7.7)
Neutrophils Relative %: 80 %
Platelets: 259 10*3/uL (ref 150–400)
RBC: 4.11 MIL/uL (ref 3.87–5.11)
RDW: 13 % (ref 11.5–15.5)
WBC: 12 10*3/uL — ABNORMAL HIGH (ref 4.0–10.5)

## 2016-04-09 MED ORDER — SODIUM CHLORIDE 0.9 % IV BOLUS (SEPSIS)
1000.0000 mL | Freq: Once | INTRAVENOUS | Status: AC
  Administered 2016-04-09: 1000 mL via INTRAVENOUS

## 2016-04-09 NOTE — ED Notes (Signed)
Bed: [id number] Expected date:  Expected time:  Means of arrival:  Comments: EMS- unwitnessed fall

## 2016-04-09 NOTE — ED Triage Notes (Signed)
Patient here from home where she lives alone with complaints of fall today. Hx of dementia.

## 2016-04-09 NOTE — ED Notes (Signed)
Patient transported to X-ray. Delay in blood draw. 

## 2016-04-09 NOTE — ED Notes (Signed)
Pt. Ambulated with no assist with a walker. Nurse aware.
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
## 2016-04-15 DIAGNOSIS — H35359 Cystoid macular degeneration, unspecified eye: Secondary | ICD-10-CM | POA: Diagnosis not present

## 2016-07-22 DIAGNOSIS — E039 Hypothyroidism, unspecified: Secondary | ICD-10-CM | POA: Diagnosis not present

## 2016-07-22 DIAGNOSIS — E7800 Pure hypercholesterolemia, unspecified: Secondary | ICD-10-CM | POA: Diagnosis not present

## 2016-08-09 DIAGNOSIS — R739 Hyperglycemia, unspecified: Secondary | ICD-10-CM | POA: Diagnosis not present

## 2016-08-09 DIAGNOSIS — E039 Hypothyroidism, unspecified: Secondary | ICD-10-CM | POA: Diagnosis not present

## 2016-08-09 DIAGNOSIS — R21 Rash and other nonspecific skin eruption: Secondary | ICD-10-CM | POA: Diagnosis not present

## 2016-08-09 DIAGNOSIS — E7800 Pure hypercholesterolemia, unspecified: Secondary | ICD-10-CM | POA: Diagnosis not present

## 2016-09-27 ENCOUNTER — Other Ambulatory Visit: Payer: Self-pay | Admitting: Internal Medicine

## 2016-09-27 DIAGNOSIS — E039 Hypothyroidism, unspecified: Secondary | ICD-10-CM | POA: Diagnosis not present

## 2016-09-27 DIAGNOSIS — R42 Dizziness and giddiness: Secondary | ICD-10-CM | POA: Diagnosis not present

## 2016-09-27 DIAGNOSIS — R739 Hyperglycemia, unspecified: Secondary | ICD-10-CM | POA: Diagnosis not present

## 2016-09-27 DIAGNOSIS — R531 Weakness: Secondary | ICD-10-CM | POA: Diagnosis not present

## 2016-10-09 ENCOUNTER — Ambulatory Visit
Admission: RE | Admit: 2016-10-09 | Discharge: 2016-10-09 | Disposition: A | Discharge status: Home / Self Care | Payer: Medicare Other | Source: Ambulatory Visit | Attending: Internal Medicine | Admitting: Internal Medicine

## 2016-10-09 DIAGNOSIS — R42 Dizziness and giddiness: Secondary | ICD-10-CM

## 2016-10-09 DIAGNOSIS — F0390 Unspecified dementia without behavioral disturbance: Secondary | ICD-10-CM | POA: Diagnosis not present

## 2016-11-29 ENCOUNTER — Ambulatory Visit (INDEPENDENT_AMBULATORY_CARE_PROVIDER_SITE_OTHER): Payer: Medicare Other | Admitting: Neurology

## 2016-11-29 ENCOUNTER — Encounter: Payer: Self-pay | Admitting: Neurology

## 2016-11-29 ENCOUNTER — Encounter (INDEPENDENT_AMBULATORY_CARE_PROVIDER_SITE_OTHER): Payer: Self-pay

## 2016-11-29 VITALS — BP 123/75 | HR 78 | Ht 63.0 in | Wt 157.5 lb

## 2016-11-29 DIAGNOSIS — H538 Other visual disturbances: Secondary | ICD-10-CM

## 2016-11-29 DIAGNOSIS — R269 Unspecified abnormalities of gait and mobility: Secondary | ICD-10-CM

## 2016-11-29 DIAGNOSIS — R413 Other amnesia: Secondary | ICD-10-CM | POA: Diagnosis not present

## 2016-11-29 DIAGNOSIS — E538 Deficiency of other specified B group vitamins: Secondary | ICD-10-CM | POA: Diagnosis not present

## 2016-11-29 HISTORY — DX: Unspecified abnormalities of gait and mobility: R26.9
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
## 2016-11-29 NOTE — Patient Instructions (Signed)
   We will get an opthamology referral and get physical therapy to work on balance.

## 2016-12-03 ENCOUNTER — Telehealth: Payer: Self-pay | Admitting: *Deleted

## 2016-12-03 LAB — RPR: RPR Ser Ql: NONREACTIVE

## 2016-12-03 LAB — SEDIMENTATION RATE: Sed Rate: 7 mm/hr (ref 0–40)

## 2016-12-03 LAB — VITAMIN B12: Vitamin B-12: 869 pg/mL (ref 232–1245)

## 2016-12-03 LAB — COPPER, SERUM: Copper: 121 ug/dL (ref 72–166)

## 2016-12-03 NOTE — Telephone Encounter (Signed)
Called and spoke with patient's daughter (on [place]) about unremarkable labs per CW,MD note. She verbalized understanding. She states she is getting eye doctor appt set up. They have already tried to call and schedule appt at [facility] office.

## 2016-12-03 NOTE — Telephone Encounter (Signed)
-----   Message from Kathrynn Ducking, MD sent at 12/03/2016  7:48 AM EDT -----  The blood work results are unremarkable. Please call the patient.  ----- Message ----- From: Lavone Neri Lab Results In Sent: 11/30/2016   7:41 AM To: Kathrynn Ducking, MD

## 2016-12-24 DIAGNOSIS — H353132 Nonexudative age-related macular degeneration, bilateral, intermediate dry stage: Secondary | ICD-10-CM | POA: Diagnosis not present

## 2016-12-24 DIAGNOSIS — H04123 Dry eye syndrome of bilateral lacrimal glands: Secondary | ICD-10-CM | POA: Diagnosis not present

## 2016-12-24 DIAGNOSIS — H1852 Epithelial (juvenile) corneal dystrophy: Secondary | ICD-10-CM | POA: Diagnosis not present

## 2016-12-24 DIAGNOSIS — Z961 Presence of intraocular lens: Secondary | ICD-10-CM | POA: Diagnosis not present

## 2016-12-27 DIAGNOSIS — R609 Edema, unspecified: Secondary | ICD-10-CM | POA: Diagnosis not present

## 2016-12-27 DIAGNOSIS — R112 Nausea with vomiting, unspecified: Secondary | ICD-10-CM | POA: Diagnosis not present

## 2016-12-27 DIAGNOSIS — G1229 Other motor neuron disease: Secondary | ICD-10-CM | POA: Diagnosis not present

## 2016-12-27 DIAGNOSIS — N390 Urinary tract infection, site not specified: Secondary | ICD-10-CM | POA: Diagnosis not present

## 2016-12-27 DIAGNOSIS — E7800 Pure hypercholesterolemia, unspecified: Secondary | ICD-10-CM | POA: Diagnosis not present

## 2016-12-27 DIAGNOSIS — E039 Hypothyroidism, unspecified: Secondary | ICD-10-CM | POA: Diagnosis not present

## 2017-01-06 ENCOUNTER — Ambulatory Visit: Payer: Medicare Other | Admitting: Physical Therapy

## 2017-02-24 DIAGNOSIS — Z23 Encounter for immunization: Secondary | ICD-10-CM | POA: Diagnosis not present

## 2017-02-24 DIAGNOSIS — D81818 Other biotin-dependent carboxylase deficiency: Secondary | ICD-10-CM | POA: Diagnosis not present

## 2017-02-24 DIAGNOSIS — R739 Hyperglycemia, unspecified: Secondary | ICD-10-CM | POA: Diagnosis not present

## 2017-02-24 DIAGNOSIS — E7800 Pure hypercholesterolemia, unspecified: Secondary | ICD-10-CM | POA: Diagnosis not present

## 2017-02-24 DIAGNOSIS — E039 Hypothyroidism, unspecified: Secondary | ICD-10-CM | POA: Diagnosis not present

## 2017-05-05 ENCOUNTER — Ambulatory Visit (INDEPENDENT_AMBULATORY_CARE_PROVIDER_SITE_OTHER): Payer: Medicare Other | Admitting: Neurology

## 2017-05-05 ENCOUNTER — Encounter: Payer: Self-pay | Admitting: Neurology

## 2017-05-05 VITALS — BP 125/69 | HR 82 | Ht 63.0 in | Wt 156.0 lb

## 2017-05-05 DIAGNOSIS — R413 Other amnesia: Secondary | ICD-10-CM

## 2017-05-05 DIAGNOSIS — R269 Unspecified abnormalities of gait and mobility: Secondary | ICD-10-CM | POA: Diagnosis not present
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
## 2017-05-20 DIAGNOSIS — D81818 Other biotin-dependent carboxylase deficiency: Secondary | ICD-10-CM | POA: Diagnosis not present

## 2017-05-20 DIAGNOSIS — E7800 Pure hypercholesterolemia, unspecified: Secondary | ICD-10-CM | POA: Diagnosis not present

## 2017-05-20 DIAGNOSIS — R739 Hyperglycemia, unspecified: Secondary | ICD-10-CM | POA: Diagnosis not present

## 2017-05-20 DIAGNOSIS — E039 Hypothyroidism, unspecified: Secondary | ICD-10-CM | POA: Diagnosis not present

## 2017-05-26 ENCOUNTER — Emergency Department (HOSPITAL_COMMUNITY): Payer: Medicare Other

## 2017-05-26 ENCOUNTER — Inpatient Hospital Stay (HOSPITAL_COMMUNITY)
Admission: EM | Admit: 2017-05-26 | Discharge: 2017-05-28 | DRG: 690 | Disposition: A | Discharge status: Home Health | Payer: Medicare Other | Attending: Family Medicine | Admitting: Family Medicine

## 2017-05-26 ENCOUNTER — Encounter (HOSPITAL_COMMUNITY): Payer: Self-pay | Admitting: Internal Medicine

## 2017-05-26 DIAGNOSIS — J9811 Atelectasis: Secondary | ICD-10-CM | POA: Diagnosis not present

## 2017-05-26 DIAGNOSIS — Z79899 Other long term (current) drug therapy: Secondary | ICD-10-CM

## 2017-05-26 DIAGNOSIS — R402441 Other coma, without documented Glasgow coma scale score, or with partial score reported, in the field [EMT or ambulance]: Secondary | ICD-10-CM | POA: Diagnosis not present

## 2017-05-26 DIAGNOSIS — E039 Hypothyroidism, unspecified: Secondary | ICD-10-CM | POA: Diagnosis present

## 2017-05-26 DIAGNOSIS — E860 Dehydration: Secondary | ICD-10-CM | POA: Diagnosis present

## 2017-05-26 DIAGNOSIS — Y92000 Kitchen of unspecified non-institutional (private) residence as  the place of occurrence of the external cause: Secondary | ICD-10-CM

## 2017-05-26 DIAGNOSIS — E785 Hyperlipidemia, unspecified: Secondary | ICD-10-CM | POA: Diagnosis present

## 2017-05-26 DIAGNOSIS — Z888 Allergy status to other drugs, medicaments and biological substances status: Secondary | ICD-10-CM

## 2017-05-26 DIAGNOSIS — Z882 Allergy status to sulfonamides status: Secondary | ICD-10-CM

## 2017-05-26 DIAGNOSIS — R197 Diarrhea, unspecified: Secondary | ICD-10-CM | POA: Diagnosis present

## 2017-05-26 DIAGNOSIS — R531 Weakness: Secondary | ICD-10-CM | POA: Diagnosis not present

## 2017-05-26 DIAGNOSIS — N390 Urinary tract infection, site not specified: Principal | ICD-10-CM | POA: Diagnosis present

## 2017-05-26 DIAGNOSIS — Z881 Allergy status to other antibiotic agents status: Secondary | ICD-10-CM

## 2017-05-26 DIAGNOSIS — F0390 Unspecified dementia without behavioral disturbance: Secondary | ICD-10-CM | POA: Diagnosis present

## 2017-05-26 DIAGNOSIS — I951 Orthostatic hypotension: Secondary | ICD-10-CM | POA: Diagnosis present

## 2017-05-26 DIAGNOSIS — T796XXA Traumatic ischemia of muscle, initial encounter: Secondary | ICD-10-CM | POA: Diagnosis not present

## 2017-05-26 DIAGNOSIS — W19XXXA Unspecified fall, initial encounter: Secondary | ICD-10-CM | POA: Diagnosis present

## 2017-05-26 DIAGNOSIS — R4020 Unspecified coma: Secondary | ICD-10-CM | POA: Diagnosis not present

## 2017-05-26 DIAGNOSIS — R402413 Glasgow coma scale score 13-15, at hospital admission: Secondary | ICD-10-CM | POA: Diagnosis present

## 2017-05-26 DIAGNOSIS — F329 Major depressive disorder, single episode, unspecified: Secondary | ICD-10-CM | POA: Diagnosis present

## 2017-05-26 DIAGNOSIS — M1990 Unspecified osteoarthritis, unspecified site: Secondary | ICD-10-CM | POA: Diagnosis present

## 2017-05-26 LAB — COMPREHENSIVE METABOLIC PANEL
ALT: 19 U/L (ref 14–54)
AST: 66 U/L — ABNORMAL HIGH (ref 15–41)
Albumin: 3.6 g/dL (ref 3.5–5.0)
Alkaline Phosphatase: 42 U/L (ref 38–126)
Anion gap: 8 (ref 5–15)
BUN: 23 mg/dL — ABNORMAL HIGH (ref 6–20)
CO2: 26 mmol/L (ref 22–32)
Calcium: 8.9 mg/dL (ref 8.9–10.3)
Chloride: 101 mmol/L (ref 101–111)
Creatinine, Ser: 0.91 mg/dL (ref 0.44–1.00)
GFR calc Af Amer: >60 mL/min (ref >60)
GFR calc non Af Amer: 55 mL/min — ABNORMAL LOW (ref >60)
Glucose, Bld: 113 mg/dL — ABNORMAL HIGH (ref 65–99)
Potassium: 3.9 mmol/L (ref 3.5–5.1)
Sodium: 135 mmol/L (ref 135–145)
Total Bilirubin: 0.4 mg/dL (ref 0.3–1.2)
Total Protein: 6.6 g/dL (ref 6.5–8.1)

## 2017-05-26 LAB — URINALYSIS, ROUTINE W REFLEX MICROSCOPIC
Bilirubin Urine: NEGATIVE
Glucose, UA: NEGATIVE mg/dL
Hgb urine dipstick: NEGATIVE
Ketones, ur: 5 mg/dL — AB
Nitrite: NEGATIVE
Protein, ur: NEGATIVE mg/dL
Specific Gravity, Urine: 1.021 (ref 1.005–1.030)
pH: 6 (ref 5.0–8.0)

## 2017-05-26 LAB — POC OCCULT BLOOD, ED: Fecal Occult Bld: NEGATIVE

## 2017-05-26 LAB — CBC WITH DIFFERENTIAL/PLATELET
Basophils Absolute: 0 10*3/uL (ref 0.0–0.1)
Basophils Relative: 0 %
Eosinophils Absolute: 0 10*3/uL (ref 0.0–0.7)
Eosinophils Relative: 0 %
HCT: 38.2 % (ref 36.0–46.0)
Hemoglobin: 12.6 g/dL (ref 12.0–15.0)
Lymphocytes Relative: 5 %
Lymphs Abs: 0.5 10*3/uL — ABNORMAL LOW (ref 0.7–4.0)
MCH: 31.8 pg (ref 26.0–34.0)
MCHC: 33 g/dL (ref 30.0–36.0)
MCV: 96.5 fL (ref 78.0–100.0)
Monocytes Absolute: 0.6 10*3/uL (ref 0.1–1.0)
Monocytes Relative: 6 %
Neutro Abs: 9.3 10*3/uL — ABNORMAL HIGH (ref 1.7–7.7)
Neutrophils Relative %: 89 %
Platelets: 246 10*3/uL (ref 150–400)
RBC: 3.96 MIL/uL (ref 3.87–5.11)
RDW: 12.9 % (ref 11.5–15.5)
WBC: 10.4 10*3/uL (ref 4.0–10.5)

## 2017-05-26 LAB — I-STAT CG4 LACTIC ACID, ED: Lactic Acid, Venous: 0.66 mmol/L (ref 0.5–1.9)

## 2017-05-26 LAB — I-STAT TROPONIN, ED: Troponin i, poc: 0.01 ng/mL (ref 0.00–0.08)

## 2017-05-26 LAB — CK: Total CK: 2342 U/L — ABNORMAL HIGH (ref 38–234)

## 2017-05-26 MED ORDER — ACETAMINOPHEN 500 MG PO TABS
1000.0000 mg | ORAL_TABLET | Freq: Once | ORAL | Status: AC
  Administered 2017-05-26: 1000 mg via ORAL
  Filled 2017-05-26: qty 2

## 2017-05-26 MED ORDER — ONDANSETRON HCL 4 MG/2ML IJ SOLN
4.0000 mg | Freq: Four times a day (QID) | INTRAMUSCULAR | Status: DC | PRN
  Administered 2017-05-28: 4 mg via INTRAVENOUS
  Filled 2017-05-26: qty 2

## 2017-05-26 MED ORDER — ACETAMINOPHEN 650 MG RE SUPP: 650.0000 mg | Freq: Four times a day (QID) | RECTAL | Status: DC | PRN

## 2017-05-26 MED ORDER — SODIUM CHLORIDE 0.9 % IV SOLN
INTRAVENOUS | Status: DC
  Administered 2017-05-26 – 2017-05-27 (×2): via INTRAVENOUS

## 2017-05-26 MED ORDER — PRAVASTATIN SODIUM 40 MG PO TABS
40.0000 mg | ORAL_TABLET | Freq: Every day | ORAL | Status: DC
  Administered 2017-05-26: 40 mg via ORAL
  Filled 2017-05-26: qty 1

## 2017-05-26 MED ORDER — LEVOTHYROXINE SODIUM 75 MCG PO TABS
75.0000 ug | ORAL_TABLET | Freq: Every day | ORAL | Status: DC
  Administered 2017-05-27 – 2017-05-28 (×2): 75 ug via ORAL
  Filled 2017-05-26 (×2): qty 1

## 2017-05-26 MED ORDER — DEXTROSE 5 % IV SOLN
1.0000 g | Freq: Once | INTRAVENOUS | Status: AC
  Administered 2017-05-26: 1 g via INTRAVENOUS
  Filled 2017-05-26: qty 10

## 2017-05-26 MED ORDER — FLUOXETINE HCL 10 MG PO CAPS
10.0000 mg | ORAL_CAPSULE | Freq: Every day | ORAL | Status: DC
  Administered 2017-05-26 – 2017-05-27 (×2): 10 mg via ORAL
  Filled 2017-05-26 (×2): qty 1

## 2017-05-26 MED ORDER — SODIUM CHLORIDE 0.9 % IV BOLUS (SEPSIS)
1000.0000 mL | Freq: Once | INTRAVENOUS | Status: AC
Start: 2017-05-26 — End: 2017-05-26
  Administered 2017-05-26: 1000 mL via INTRAVENOUS

## 2017-05-26 MED ORDER — HEPARIN SODIUM (PORCINE) 5000 UNIT/ML IJ SOLN
5000.0000 [IU] | Freq: Three times a day (TID) | INTRAMUSCULAR | Status: DC
  Administered 2017-05-26 – 2017-05-28 (×5): 5000 [IU] via SUBCUTANEOUS
  Filled 2017-05-26 (×6): qty 1

## 2017-05-26 MED ORDER — FUROSEMIDE 40 MG PO TABS: 20.0000 mg | ORAL_TABLET | Freq: Every day | ORAL | Status: DC

## 2017-05-26 MED ORDER — DONEPEZIL HCL 5 MG PO TABS
10.0000 mg | ORAL_TABLET | Freq: Every day | ORAL | Status: DC
  Administered 2017-05-26 – 2017-05-27 (×2): 10 mg via ORAL
  Filled 2017-05-26 (×2): qty 2

## 2017-05-26 MED ORDER — SODIUM CHLORIDE 0.9 % IV BOLUS (SEPSIS)
1000.0000 mL | Freq: Once | INTRAVENOUS | Status: AC
  Administered 2017-05-26: 1000 mL via INTRAVENOUS

## 2017-05-26 MED ORDER — ACETAMINOPHEN 325 MG PO TABS
650.0000 mg | ORAL_TABLET | Freq: Four times a day (QID) | ORAL | Status: DC | PRN
  Administered 2017-05-27 – 2017-05-28 (×2): 650 mg via ORAL
  Filled 2017-05-26 (×2): qty 2

## 2017-05-26 MED ORDER — ONDANSETRON HCL 4 MG PO TABS: 4.0000 mg | ORAL_TABLET | Freq: Four times a day (QID) | ORAL | Status: DC | PRN

## 2017-05-26 NOTE — Plan of Care (Signed)
  Progressing Clinical Measurements: Ability to maintain clinical measurements within normal limits will improve 05/26/2017 2218 - Progressing by Talbert Forest, RN Nutrition: Adequate nutrition will be maintained 05/26/2017 2218 - Progressing by Talbert Forest, RN Elimination: Will not experience complications related to bowel motility 05/26/2017 2218 - Progressing by Talbert Forest, RN Will not experience complications related to urinary retention 05/26/2017 2218 - Progressing by Talbert Forest, RN Pain Managment: General experience of comfort will improve 05/26/2017 2218 - Progressing by Talbert Forest, RN Safety: Ability to remain free from injury will improve 05/26/2017 2218 - Progressing by Talbert Forest, RN
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
## 2017-05-26 NOTE — ED Triage Notes (Signed)
Pt arrived via [facility] from home after an unwitnessed fall this a.m. Daughter found patient on floor in kitchen this morning.  Patient is unable to recall the event. Patient's daughter reports pt has had increased weakness x2 days. Patient is normally ambulatory without cane or walker. Per EMS, patient was weak and unsteady upon standing. Hx of dementia at baseline.

## 2017-05-26 NOTE — ED Notes (Signed)
ED Provider at bedside. 
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
## 2017-05-26 NOTE — ED Notes (Signed)
Bed: PO14 Expected date:  Expected time:  Means of arrival:  Comments: EMS- elderly, weakness/fall

## 2017-05-27 ENCOUNTER — Other Ambulatory Visit: Payer: Self-pay

## 2017-05-27 DIAGNOSIS — I951 Orthostatic hypotension: Secondary | ICD-10-CM | POA: Diagnosis present

## 2017-05-27 DIAGNOSIS — Z881 Allergy status to other antibiotic agents status: Secondary | ICD-10-CM | POA: Diagnosis not present

## 2017-05-27 DIAGNOSIS — N390 Urinary tract infection, site not specified: Secondary | ICD-10-CM | POA: Diagnosis present

## 2017-05-27 DIAGNOSIS — W19XXXA Unspecified fall, initial encounter: Secondary | ICD-10-CM | POA: Diagnosis not present

## 2017-05-27 DIAGNOSIS — Z882 Allergy status to sulfonamides status: Secondary | ICD-10-CM | POA: Diagnosis not present

## 2017-05-27 DIAGNOSIS — R197 Diarrhea, unspecified: Secondary | ICD-10-CM | POA: Diagnosis present

## 2017-05-27 DIAGNOSIS — T796XXA Traumatic ischemia of muscle, initial encounter: Secondary | ICD-10-CM | POA: Diagnosis present

## 2017-05-27 DIAGNOSIS — F329 Major depressive disorder, single episode, unspecified: Secondary | ICD-10-CM | POA: Diagnosis present

## 2017-05-27 DIAGNOSIS — R531 Weakness: Secondary | ICD-10-CM | POA: Diagnosis not present

## 2017-05-27 DIAGNOSIS — Z79899 Other long term (current) drug therapy: Secondary | ICD-10-CM | POA: Diagnosis not present

## 2017-05-27 DIAGNOSIS — M1990 Unspecified osteoarthritis, unspecified site: Secondary | ICD-10-CM | POA: Diagnosis present

## 2017-05-27 DIAGNOSIS — Z888 Allergy status to other drugs, medicaments and biological substances status: Secondary | ICD-10-CM | POA: Diagnosis not present

## 2017-05-27 DIAGNOSIS — E039 Hypothyroidism, unspecified: Secondary | ICD-10-CM | POA: Diagnosis present

## 2017-05-27 DIAGNOSIS — E860 Dehydration: Secondary | ICD-10-CM | POA: Diagnosis present

## 2017-05-27 DIAGNOSIS — E785 Hyperlipidemia, unspecified: Secondary | ICD-10-CM | POA: Diagnosis present

## 2017-05-27 DIAGNOSIS — Y92000 Kitchen of unspecified non-institutional (private) residence as  the place of occurrence of the external cause: Secondary | ICD-10-CM | POA: Diagnosis not present

## 2017-05-27 DIAGNOSIS — F0390 Unspecified dementia without behavioral disturbance: Secondary | ICD-10-CM | POA: Diagnosis present

## 2017-05-27 DIAGNOSIS — R402413 Glasgow coma scale score 13-15, at hospital admission: Secondary | ICD-10-CM | POA: Diagnosis present

## 2017-05-27 DIAGNOSIS — T796XXD Traumatic ischemia of muscle, subsequent encounter: Secondary | ICD-10-CM | POA: Diagnosis not present

## 2017-05-27 LAB — BASIC METABOLIC PANEL
Anion gap: 7 (ref 5–15)
BUN: 19 mg/dL (ref 6–20)
CO2: 22 mmol/L (ref 22–32)
Calcium: 8.3 mg/dL — ABNORMAL LOW (ref 8.9–10.3)
Chloride: 109 mmol/L (ref 101–111)
Creatinine, Ser: 0.71 mg/dL (ref 0.44–1.00)
GFR calc Af Amer: >60 mL/min (ref >60)
GFR calc non Af Amer: >60 mL/min (ref >60)
Glucose, Bld: 86 mg/dL (ref 65–99)
Potassium: 3.5 mmol/L (ref 3.5–5.1)
Sodium: 138 mmol/L (ref 135–145)

## 2017-05-27 LAB — CBC
HCT: 36 % (ref 36.0–46.0)
Hemoglobin: 11.7 g/dL — ABNORMAL LOW (ref 12.0–15.0)
MCH: 31.7 pg (ref 26.0–34.0)
MCHC: 32.5 g/dL (ref 30.0–36.0)
MCV: 97.6 fL (ref 78.0–100.0)
Platelets: 228 10*3/uL (ref 150–400)
RBC: 3.69 MIL/uL — ABNORMAL LOW (ref 3.87–5.11)
RDW: 13.2 % (ref 11.5–15.5)
WBC: 8 10*3/uL (ref 4.0–10.5)

## 2017-05-27 LAB — TSH: TSH: 6.279 u[IU]/mL — ABNORMAL HIGH (ref 0.350–4.500)

## 2017-05-27 LAB — URINE CULTURE

## 2017-05-27 LAB — CK: Total CK: 2504 U/L — ABNORMAL HIGH (ref 38–234)

## 2017-05-27 MED ORDER — SODIUM CHLORIDE 0.9 % IV SOLN
INTRAVENOUS | Status: AC
  Administered 2017-05-27: 14:00:00 via INTRAVENOUS

## 2017-05-27 MED ORDER — DEXTROSE 5 % IV SOLN
1.0000 g | INTRAVENOUS | Status: DC
  Administered 2017-05-27: 1 g via INTRAVENOUS
  Filled 2017-05-27: qty 10

## 2017-05-27 NOTE — Care Management Note (Signed)
Case Management Note  Patient Details  Name: Monica [name] MRN: [id number] Date of Birth: June 14, 1927  Subjective/Objective:  84 y/o f admitted w/Fall. From home alone. PT cons-await recc.                  Action/Plan:d/c plan home w/HHC   Expected Discharge Date:  (unknown)               Expected Discharge Plan:     In-House Referral:     Discharge planning Services     Post Acute Care Choice:    Choice offered to:     DME Arranged:    DME Agency:     HH Arranged:    HH Agency:     Status of Service:     If discussed at [employer], dates discussed:    Additional Comments:  Dessa Phi, RN 05/27/2017, 11:49 AM

## 2017-05-27 NOTE — Plan of Care (Signed)
  Elimination: Will not experience complications related to bowel motility 05/27/2017 2354 - Not Progressing by Kyrollos Cordell, Sherryll Burger, RN   Pt with watery diarrhea

## 2017-05-27 NOTE — Progress Notes (Addendum)
PROGRESS NOTE  [name] PTW:[id number] DOB: May 06, 1927 DOA: 05/26/2017 PCP: [name], MD  HPI/Recap of past 24 hours:  Monica [name] is a 84 y.o. year old female with medical history significant for of hyperlipidemia, hypothyroidism, progressive memory disturbance  who presented on 05/26/2017  after being found down for unknown period of time by her daughter with diarrhea and was found to have rhabdomyolysis in the setting of a fall, diarrhea, UTI.    This morning, patient is doing much better.  Has not had any recurrent episodes of diarrhea.  Doing well with her diet.  Daughter states patient lives alone.  Though daughter manages her medications (daughter lives in [place])  Assessment/Plan: Active Problems:   Fall   Traumatic rhabdomyolysis ([place])   Acute lower UTI   Diarrhea   Increased weakness, suspect orthostatic hypotension related to reported diarrhea.  Potentially related to dehydration related to reported diarrhea.  On admission has had relative hypotension with systolics ranging 70Y-17C.  Having some improvement with gentle hydration currently.  It is clear from elevated CK patient had a fall and was down for unknown period of time.  An inpatient workup so far: Possible UTI.  Kidney function normal, no electrolyte abnormalities that would explain weakness currently.  No anemia, no leukocytosis.  Troponin negative, EKG with no ischemic changes, chest x-ray within normal limits.  CT head unrevealing. -Orthostatic vitals -Continue gentle hydration - GIP pathogen panel if diarrhea occurs in hospital  Rhabdomyolysis in the setting of a fall.   CT head negative for any intracranial abnormality/acute hematomas or bleeding.  CK downtrending currently 2300 down from 2500. Continue gentle hydration over the next 8 hours, afterwards allow oral hydration alone Continue to monitor BMP (to ensure no kidney dysfunction, currently within normal limits) Repeat CK in the  a.m. after monitoring with oral hydration  UTI? UA shows moderate leukocytes, rare bacteria, urine culture pending.  Patient on empiric ceftriaxone.  Possible contributing factor to fall.  Patient not complaining currently of dysuria. -Follow-up urine culture -Continue IV ceftriaxone, anticipate being able to de-escalate to orals 24 hours pending results of urine culture   Progressive memory disturbance.  Followed by [facility].  Last appointment on 05/05/17.  Continue home Aricept.  Has not tolerated Namenda in the past.  Patient lives alone, daughter plans to seek afternoon care, -OT recommends 24-hour assistance -Follow-up PT recommendations  Hypothyroidism.  TSH 6.2.  Normal TSH value given patient greater than age 46 upper limit of normal TSH is 8.5.  Continue Synthroid at current dose Hyperlipidemia.  Statin discontinued in setting of elevated CK. Depression. continue fluoxetine Code Status: Full Code  Family Communication: Updated daughter at bedside  Disposition Plan: Continue IV fluids, repeat CK in a.m., likely discharge on 05/28/17   Consultants:  None  Procedures:  None  Antimicrobials:  Ceftriaxone 1/28>>  Cultures:  05/26/17: Urine culture pending  DVT prophylaxis: heparin   Objective: Vitals:   05/26/17 1830 05/26/17 2058 05/27/17 0500 05/27/17 0522  BP: (!) 102/54 (!) 103/54  (!) 108/47  Pulse: 79 80  64  Resp: (!) 23 18  20   Temp:  98.4 F (36.9 C)  97.7 F (36.5 C)  TempSrc:  Oral  Oral  SpO2: 96% 99%  99%  Weight:  72.6 kg (160 lb 0.9 oz) 72.5 kg (159 lb 13.3 oz)   Height:  5\' 4"  (1.626 m)      Intake/Output Summary (Last 24 hours) at 05/27/2017 1407 Last data  filed at 05/27/2017 1330 Gross per 24 hour  Intake 3118.75 ml  Output -  Net 3118.75 ml   Filed Weights   05/26/17 2058 05/27/17 0500  Weight: 72.6 kg (160 lb 0.9 oz) 72.5 kg (159 lb 13.3 oz)    Exam:  General: Sitting in bedside chair, eating  lunch Cardiovascular: Regular rate and rhythm, no murmurs rubs or gallops, no peripheral edema GI: Soft abdomen, nondistended, nontender, normoactive bowel sounds Skin: Area of ecchymoses right lower back, tender to touch Neuro: Alert, pleasantly demented, oriented to person, context  Data Reviewed: CBC: Recent Labs  Lab 05/26/17 1200 05/27/17 0507  WBC 10.4 8.0  NEUTROABS 9.3*  --   HGB 12.6 11.7*  HCT 38.2 36.0  MCV 96.5 97.6  PLT 246 675   Basic Metabolic Panel: Recent Labs  Lab 05/26/17 1200 05/27/17 0507  NA 135 138  K 3.9 3.5  CL 101 109  CO2 26 22  GLUCOSE 113* 86  BUN 23* 19  CREATININE 0.91 0.71  CALCIUM 8.9 8.3*   GFR: Estimated Creatinine Clearance: 48.3 mL/min (by C-G formula based on SCr of 0.71 mg/dL). Liver Function Tests: Recent Labs  Lab 05/26/17 1200  AST 66*  ALT 19  ALKPHOS 42  BILITOT 0.4  PROT 6.6  ALBUMIN 3.6   No results for input(s): LIPASE, AMYLASE in the last 168 hours. No results for input(s): AMMONIA in the last 168 hours. Coagulation Profile: No results for input(s): INR, PROTIME in the last 168 hours. Cardiac Enzymes: Recent Labs  Lab 05/26/17 1200 05/27/17 0507  CKTOTAL 2,342* 2,504*   BNP (last 3 results) No results for input(s): PROBNP in the last 8760 hours. HbA1C: No results for input(s): HGBA1C in the last 72 hours. CBG: No results for input(s): GLUCAP in the last 168 hours. Lipid Profile: No results for input(s): CHOL, HDL, LDLCALC, TRIG, CHOLHDL, LDLDIRECT in the last 72 hours. Thyroid Function Tests: Recent Labs    05/27/17 0748  TSH 6.279*   Anemia Panel: No results for input(s): VITAMINB12, FOLATE, FERRITIN, TIBC, IRON, RETICCTPCT in the last 72 hours. Urine analysis:    Component Value Date/Time   COLORURINE YELLOW 05/26/2017 [place] 05/26/2017 1420   LABSPEC 1.021 05/26/2017 1420   PHURINE 6.0 05/26/2017 1420   GLUCOSEU NEGATIVE 05/26/2017 1420   HGBUR NEGATIVE 05/26/2017  1420   BILIRUBINUR NEGATIVE 05/26/2017 1420   KETONESUR 5 (A) 05/26/2017 1420   PROTEINUR NEGATIVE 05/26/2017 1420   NITRITE NEGATIVE 05/26/2017 1420   LEUKOCYTESUR MODERATE (A) 05/26/2017 1420   Sepsis Labs: @LABRCNTIP (procalcitonin:4,lacticidven:4)  )No results found for this or any previous visit (from the past 240 hour(s)).    Studies: No results found.  Scheduled Meds: . donepezil  10 mg Oral QHS  . FLUoxetine  10 mg Oral QHS  . heparin  5,000 Units Subcutaneous Q8H  . levothyroxine  75 mcg Oral QAC breakfast    Continuous Infusions: . sodium chloride    . cefTRIAXone (ROCEPHIN)  IV       LOS: 0 days     Desiree Hane, MD [facility] Pager [phone]  If 7PM-7AM, please contact night-coverage [email or web address] Password [email or web address] 05/27/2017, 2:07 PM

## 2017-05-27 NOTE — Evaluation (Signed)
Physical Therapy Evaluation Patient Details Name: Monica [name] MRN: [id number] DOB: [birth year] Today's Date: 05/27/2017   History of Present Illness  Monica [name] is a 84 y.o. female medical history significant for hypothyroid, memory disturbance and general weakness presents the emergency room with chief complaint of decreased appetite and diarrhea.   Clinical Impression  Pt admitted with above diagnosis. Pt currently with functional limitations due to the deficits listed below (see PT Problem List). *Pt will benefit from skilled PT to increase their independence and safety with mobility to allow discharge to the venue listed below.       Follow Up Recommendations Home health PT;Supervision for mobility/OOB    Equipment Recommendations  (?rollator)    Recommendations for Other Services       Precautions / Restrictions Precautions Precautions: Fall Restrictions Weight Bearing Restrictions: No      Mobility  Bed Mobility Overal bed mobility: Needs Assistance Bed Mobility: Supine to Sit     Supine to sit: Min assist     General bed mobility comments: light assist with trunk, incr time  Transfers Overall transfer level: Needs assistance Equipment used: Rolling walker (2 wheeled) Transfers: Sit to/from Stand Sit to Stand: Min guard;Min assist         General transfer comment: assist to rise and steady, pt is very unsteady when initially standing requiring UE support and assist from therapist  Ambulation/Gait Ambulation/Gait assistance: Min guard;Min assist Ambulation Distance (Feet): 120 Feet Assistive device: Rolling walker (2 wheeled);1 person hand held assist;2 person hand held assist Gait Pattern/deviations: Step-through pattern;Drifts right/left;Trunk flexed;Decreased stride length     General Gait Details: cues for Rw sfaety, pt requires assist to maneuver RW; RW removed but pt is reliant on UE assist for balance (she admits to furniture walking  at home and holding onto the walls in public)  Stairs            Wheelchair Mobility    Modified Rankin (Stroke Patients Only)       Balance Overall balance assessment: Needs assistance;History of Falls(pt reports she falls but she coan't remember how often)   Sitting balance-Leahy Scale: Good     Standing balance support: During functional activity;Bilateral upper extremity supported Standing balance-Leahy Scale: Poor Standing balance comment: reliant on UEs                              Pertinent Vitals/Pain Pain Assessment: No/denies pain    Home Living Family/patient expects to be discharged to:: Private residence Living Arrangements: Alone   Type of Home: House Home Access: Ramped entrance     Home Layout: One level Home Equipment: Grab bars - toilet;Grab bars - tub/shower      Prior Function Level of Independence: Independent               Hand Dominance        Extremity/Trunk Assessment   Upper Extremity Assessment Upper Extremity Assessment: Generalized weakness    Lower Extremity Assessment Lower Extremity Assessment: Generalized weakness       Communication   Communication: No difficulties  Cognition Arousal/Alertness: Awake/alert Behavior During Therapy: WFL for tasks assessed/performed Overall Cognitive Status: History of cognitive impairments - at baseline Area of Impairment: Memory                     Memory: Decreased short-term memory  General Comments      Exercises     Assessment/Plan    PT Assessment Patient needs continued PT services  PT Problem List Decreased strength;Decreased activity tolerance;Decreased mobility;Decreased safety awareness       PT Treatment Interventions DME instruction;Gait training;Functional mobility training;Therapeutic exercise;Therapeutic activities;Balance training;Patient/family education    PT Goals (Current goals can be found in the Care  Plan section)  Acute Rehab PT Goals Patient Stated Goal: daughters:  pt to return home PT Goal Formulation: With patient/family Potential to Achieve Goals: Good    Frequency Min 3X/week   Barriers to discharge        Co-evaluation               AM-PAC PT "6 Clicks" Daily Activity  Outcome Measure Difficulty turning over in bed (including adjusting bedclothes, sheets and blankets)?: A Little Difficulty moving from lying on back to sitting on the side of the bed? : Unable Difficulty sitting down on and standing up from a chair with arms (e.g., wheelchair, bedside commode, etc,.)?: Unable   Help needed walking in hospital room?: A Little Help needed climbing 3-5 steps with a railing? : A Little 6 Click Score: 11    End of Session Equipment Utilized During Treatment: Gait belt Activity Tolerance: Patient tolerated treatment well Patient left: in chair;with chair alarm set;with family/visitor present Nurse Communication: Mobility status PT Visit Diagnosis: Unsteadiness on feet (R26.81);History of falling (Z91.81)    Time: 9892-1194 PT Time Calculation (min) (ACUTE ONLY): 20 min   Charges:   PT Evaluation $PT Eval Low Complexity: 1 Low     PT G Codes:          Monica [name] 2017/06/09, 1:05 PM

## 2017-05-27 NOTE — Evaluation (Signed)
Occupational Therapy Evaluation Patient Details Name: Monica [name] MRN: [id number] DOB: [birth year] Today's Date: 05/27/2017    History of Present Illness Monica [name] is a 84 y.o. female medical history significant for hypothyroid, memory disturbance and general weakness presents the emergency room with chief complaint of decreased appetite and diarrhea.    Clinical Impression   Pt was admitted for the above.  At baseline, she lives alone and is independent with adls/laundry. Pt has a h/o dementia and she needed cues for safety during OT session.  Recommend 24/7, but she will not have this.  Daughter present and will hire some assistance for her.  Will follow in acute setting with mod I level goals. She mostly needs supervision at this time    Follow Up Recommendations  Supervision/Assistance - 24 hour(pt will not have 24/7; daughter will hire intermittent A) [name]   Equipment Recommendations  None recommended by OT    Recommendations for Other Services       Precautions / Restrictions Precautions Precautions: Fall Restrictions Weight Bearing Restrictions: No      Mobility Bed Mobility               General bed mobility comments: oob  Transfers Overall transfer level: Needs assistance Equipment used: Rolling walker (2 wheeled) Transfers: Sit to/from Stand Sit to Stand: Supervision         General transfer comment: cues for safety    Balance                                           ADL either performed or assessed with clinical judgement   ADL Overall ADL's : Needs assistance/impaired Eating/Feeding: Independent   Grooming: Wash/dry hands;Supervision/safety;Standing   Upper Body Bathing: Set up;Sitting   Lower Body Bathing: Supervison/ safety;Sit to/from stand   Upper Body Dressing : Minimal assistance;Sitting(iv)   Lower Body Dressing: Supervision/safety;Sit to/from stand   Toilet Transfer: Min  guard;Ambulation;RW;Comfort height toilet;Grab bars   Toileting- Clothing Manipulation and Hygiene: Supervision/safety;Sitting/lateral lean         General ADL Comments: performed ADL from recliner and walked to bathroom with RW.  Pt doesn't use AD at baseline, and she needed cues to both use it and for safety     Vision         Perception     Praxis      Pertinent Vitals/Pain Pain Assessment: No/denies pain     Hand Dominance     Extremity/Trunk Assessment Upper Extremity Assessment Upper Extremity Assessment: Generalized weakness           Communication Communication Communication: No difficulties   Cognition Arousal/Alertness: Awake/alert Behavior During Therapy: WFL for tasks assessed/performed Overall Cognitive Status: History of cognitive impairments - at baseline                                     General Comments       Exercises     Shoulder Instructions      Home Living Family/patient expects to be discharged to:: Private residence Living Arrangements: Alone   Type of Home: House Home Access: Ramped entrance     Home Layout: One level     Bathroom Shower/Tub: Teacher, early years/pre: Handicapped height     Home Equipment: Grab bars -  toilet;Grab bars - tub/shower          Prior Functioning/Environment Level of Independence: Independent                 OT Problem List: Decreased strength;Decreased activity tolerance;Pain;Decreased safety awareness;Decreased cognition;Decreased knowledge of use of DME or AE;Impaired balance (sitting and/or standing)      OT Treatment/Interventions: Self-care/ADL training;DME and/or AE instruction;Balance training;Patient/family education;Therapeutic activities    OT Goals(Current goals can be found in the care plan section) Acute Rehab OT Goals Patient Stated Goal: daughters:  pt to return home OT Goal Formulation: With patient/family Time For Goal Achievement:  06/03/17 Potential to Achieve Goals: Good ADL Goals Pt Will Transfer to Toilet: (P) with modified independence;grab bars;regular height toilet Additional ADL Goal #1: (P) pt will gather clothes and perform adl at mod I level  OT Frequency: Min 2X/week   Barriers to D/C:            Co-evaluation              AM-PAC PT "6 Clicks" Daily Activity     Outcome Measure Help from another person eating meals?: None Help from another person taking care of personal grooming?: A Little Help from another person toileting, which includes using toliet, bedpan, or urinal?: A Little Help from another person bathing (including washing, rinsing, drying)?: A Little Help from another person to put on and taking off regular upper body clothing?: A Little Help from another person to put on and taking off regular lower body clothing?: A Little 6 Click Score: 19   End of Session    Activity Tolerance: Patient tolerated treatment well Patient left: in chair;with call bell/phone within reach;with chair alarm set;with family/visitor present  OT Visit Diagnosis: Unsteadiness on feet (R26.81)                Time: 9373-4287 OT Time Calculation (min): 35 min Charges:  OT General Charges $OT Visit: 1 Visit OT Evaluation $OT Eval Low Complexity: 1 Low OT Treatments $Self Care/Home Management : 8-22 mins G-Codes:     Lesle Chris, OTR/L [phone] 05/27/2017  Ala Kratz 05/27/2017, 12:53 PM

## 2017-05-27 NOTE — Care Management Obs Status (Signed)
[place] NOTIFICATION   Patient Details  Name: Monica [name] MRN: [id number] Date of Birth: December 25, 1927   Medicare Observation Status Notification Given:  Yes    MahabirJuliann Pulse, RN 05/27/2017, 11:49 AM

## 2017-05-28 DIAGNOSIS — R531 Weakness: Secondary | ICD-10-CM

## 2017-05-28 DIAGNOSIS — T796XXA Traumatic ischemia of muscle, initial encounter: Secondary | ICD-10-CM

## 2017-05-28 DIAGNOSIS — R197 Diarrhea, unspecified: Secondary | ICD-10-CM

## 2017-05-28 DIAGNOSIS — W19XXXA Unspecified fall, initial encounter: Secondary | ICD-10-CM

## 2017-05-28 LAB — BASIC METABOLIC PANEL
Anion gap: 6 (ref 5–15)
BUN: 16 mg/dL (ref 6–20)
CO2: 21 mmol/L — ABNORMAL LOW (ref 22–32)
Calcium: 8.2 mg/dL — ABNORMAL LOW (ref 8.9–10.3)
Chloride: 110 mmol/L (ref 101–111)
Creatinine, Ser: 0.63 mg/dL (ref 0.44–1.00)
GFR calc Af Amer: >60 mL/min (ref >60)
GFR calc non Af Amer: >60 mL/min (ref >60)
Glucose, Bld: 103 mg/dL — ABNORMAL HIGH (ref 65–99)
Potassium: 3.5 mmol/L (ref 3.5–5.1)
Sodium: 137 mmol/L (ref 135–145)

## 2017-05-28 LAB — CK: Total CK: 916 U/L — ABNORMAL HIGH (ref 38–234)

## 2017-05-28 MED ORDER — LOPERAMIDE HCL 2 MG PO CAPS: 2.0000 mg | ORAL_CAPSULE | ORAL | Status: DC | PRN

## 2017-05-28 MED ORDER — BOOST PLUS PO LIQD
237.0000 mL | Freq: Once | ORAL | Status: DC
  Filled 2017-05-28: qty 237

## 2017-05-28 NOTE — Progress Notes (Signed)
OT had concerns about home w/HHC vs SNF-spoke to dtr & patient in rm-patient chose home w/HHC, patient declines SNF, dtr will provide private duty care-comfort Arbie Cookey will make a home visit tomorrow. No further CM needs.

## 2017-05-28 NOTE — Plan of Care (Signed)
  Progressing Clinical Measurements: Ability to maintain clinical measurements within normal limits will improve 05/28/2017 0841 - Progressing by Saunders Glance, RN Will remain free from infection 05/28/2017 0841 - Progressing by Saunders Glance, RN Diagnostic test results will improve 05/28/2017 0841 - Progressing by Saunders Glance, RN Respiratory complications will improve 05/28/2017 0841 - Progressing by Saunders Glance, RN Cardiovascular complication will be avoided 05/28/2017 0841 - Progressing by Saunders Glance, RN Coping: Level of anxiety will decrease 05/28/2017 0841 - Progressing by Saunders Glance, RN

## 2017-05-28 NOTE — Progress Notes (Signed)
Occupational Therapy Treatment Patient Details Name: Monica [name] MRN: [id number] DOB: 03/01/1928 Today's Date: 05/28/2017    History of present illness Monica [name] is a 84 y.o. female medical history significant for hypothyroid, memory disturbance and general weakness presents the emergency room with chief complaint of decreased appetite and diarrhea.    OT comments  Pt unsteadier today; min A to stand and assist to turn walker, otherwise min guard for ambulation to bathroom.  CM came in to talk to pt/daughter about rehab as an option; pt/daughter both feel pt is much weaker than baseline.  Pt needed more assistance today.  Daughter is unable to stay with her even today after she leaves.  Feel she should have 24/7 or SNF.  Follow Up Recommendations  SNF;Supervision/Assistance - 24 hour(HHOT)    Equipment Recommendations  None recommended by OT    Recommendations for Other Services      Precautions / Restrictions Precautions Precautions: Fall Restrictions Weight Bearing Restrictions: No       Mobility Bed Mobility         Supine to sit: Min guard     General bed mobility comments: used rails  Transfers   Equipment used: Rolling walker (2 wheeled)   Sit to Stand: Min assist         General transfer comment: assist to steady the walker. Multimodal cues given but pt did not always follow    Balance                                           ADL either performed or assessed with clinical judgement   ADL       Grooming: Wash/dry hands;Supervision/safety;Standing               Lower Body Dressing: Minimal assistance;Sit to/from stand   Toilet Transfer: Minimal assistance;Ambulation;Comfort height toilet;Grab bars;RW   Toileting- Clothing Manipulation and Hygiene: Minimal assistance;Sit to/from stand         General ADL Comments: ambulated to bathroom and changed gowns.  Pt donned socks before this with extra time. C/o  More pain today.  Pt unsteadier and needed assistance to turn walker towards bathroom and multimodal cues to push up from walker. Daughter present and cannot stay with pt at all today after d/c. They are going to meet with comfort keepers tomorrow to arrange for some pm assistance several times per week.  Pt/daughter verbalizing that they are not sure pt should return home alone.  Told them that SW could talk to them; CM went in and discussed options to see if they wanted SW as rehab was an out of pocket expense.     Vision       Perception     Praxis      Cognition Arousal/Alertness: Awake/alert Behavior During Therapy: WFL for tasks assessed/performed Overall Cognitive Status: History of cognitive impairments - at baseline Area of Impairment: Memory and safety                     Memory: Decreased short-term memory                  Exercises     Shoulder Instructions       General Comments      Pertinent Vitals/ Pain       Pain Assessment: No/denies pain  Home Living  Prior Functioning/Environment              Frequency           Progress Toward Goals  OT Goals(current goals can now be found in the care plan section)  Progress towards OT goals: Progressing toward goals     Plan      Co-evaluation                 AM-PAC PT "6 Clicks" Daily Activity     Outcome Measure   Help from another person eating meals?: None Help from another person taking care of personal grooming?: A Little Help from another person toileting, which includes using toliet, bedpan, or urinal?: A Little Help from another person bathing (including washing, rinsing, drying)?: A Little Help from another person to put on and taking off regular upper body clothing?: A Little Help from another person to put on and taking off regular lower body clothing?: A Little 6 Click Score: 19    End of Session         Activity Tolerance Patient limited by fatigue   Patient Left in chair;with [name] bell/phone within reach;with chair alarm set;with family/visitor present   Nurse Communication          Time: 6295-2841 OT Time Calculation (min): 27 min  Charges: OT General Charges $OT Visit: 1 Visit OT Treatments $Self Care/Home Management : 23-37 mins  Monica [name], OTR/L [phone] 05/28/2017   [place] 05/28/2017, 12:25 PM

## 2017-05-28 NOTE — Progress Notes (Signed)
Patient and family given discharge, medication, and follow up instructions, verbalized understanding, IV and telemetry removed, family to transport home

## 2017-05-28 NOTE — Care Management Note (Signed)
Case Management Note  Patient Details  Name: Monica [name] MRN: [id number] Date of Birth: 10-Nov-1927  Subjective/Objective: Spoke to dtr for [facility] agency choice-chose [facility] aware of [place] orders, & 4wheeled rw w/eat to deliver to rm prior d/c. No further CM needs.                   Action/Plan:d/c home w/HHC/dme.   Expected Discharge Date:  (unknown)               Expected Discharge Plan:  [place]  In-House Referral:     Discharge planning Services  CM Consult  Post Acute Care Choice:    Choice offered to:  Adult Children  DME Arranged:  Walker rolling with seat DME Agency:  [place] Arranged:  RN, PT, OT, Nurse's Aide, Social Work [employer] Agency:  [facility]  Status of Service:  Completed, signed off  If discussed at [employer], dates discussed:    Additional Comments:  Dessa Phi, RN 05/28/2017, 11:09 AM
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
## 2017-05-29 LAB — URINE CULTURE

## 2017-06-02 DIAGNOSIS — F0390 Unspecified dementia without behavioral disturbance: Secondary | ICD-10-CM | POA: Diagnosis not present

## 2017-06-02 DIAGNOSIS — R197 Diarrhea, unspecified: Secondary | ICD-10-CM | POA: Diagnosis not present

## 2017-06-02 DIAGNOSIS — E039 Hypothyroidism, unspecified: Secondary | ICD-10-CM | POA: Diagnosis not present

## 2017-06-02 DIAGNOSIS — I951 Orthostatic hypotension: Secondary | ICD-10-CM | POA: Diagnosis not present

## 2017-06-02 DIAGNOSIS — Z9181 History of falling: Secondary | ICD-10-CM | POA: Diagnosis not present

## 2017-06-02 DIAGNOSIS — F329 Major depressive disorder, single episode, unspecified: Secondary | ICD-10-CM | POA: Diagnosis not present

## 2017-06-02 DIAGNOSIS — E785 Hyperlipidemia, unspecified: Secondary | ICD-10-CM | POA: Diagnosis not present

## 2017-06-02 DIAGNOSIS — N390 Urinary tract infection, site not specified: Secondary | ICD-10-CM | POA: Diagnosis not present

## 2017-06-02 DIAGNOSIS — M1990 Unspecified osteoarthritis, unspecified site: Secondary | ICD-10-CM | POA: Diagnosis not present

## 2017-06-03 DIAGNOSIS — M1990 Unspecified osteoarthritis, unspecified site: Secondary | ICD-10-CM | POA: Diagnosis not present

## 2017-06-03 DIAGNOSIS — F0390 Unspecified dementia without behavioral disturbance: Secondary | ICD-10-CM | POA: Diagnosis not present

## 2017-06-03 DIAGNOSIS — I951 Orthostatic hypotension: Secondary | ICD-10-CM | POA: Diagnosis not present

## 2017-06-03 DIAGNOSIS — R197 Diarrhea, unspecified: Secondary | ICD-10-CM | POA: Diagnosis not present

## 2017-06-03 DIAGNOSIS — N390 Urinary tract infection, site not specified: Secondary | ICD-10-CM | POA: Diagnosis not present

## 2017-06-03 DIAGNOSIS — F329 Major depressive disorder, single episode, unspecified: Secondary | ICD-10-CM | POA: Diagnosis not present

## 2017-06-05 DIAGNOSIS — M545 Low back pain: Secondary | ICD-10-CM | POA: Diagnosis not present

## 2017-06-05 DIAGNOSIS — E039 Hypothyroidism, unspecified: Secondary | ICD-10-CM | POA: Diagnosis not present

## 2017-06-05 DIAGNOSIS — E7800 Pure hypercholesterolemia, unspecified: Secondary | ICD-10-CM | POA: Diagnosis not present

## 2017-06-09 DIAGNOSIS — F329 Major depressive disorder, single episode, unspecified: Secondary | ICD-10-CM | POA: Diagnosis not present

## 2017-06-09 DIAGNOSIS — N390 Urinary tract infection, site not specified: Secondary | ICD-10-CM | POA: Diagnosis not present

## 2017-06-09 DIAGNOSIS — I951 Orthostatic hypotension: Secondary | ICD-10-CM | POA: Diagnosis not present

## 2017-06-09 DIAGNOSIS — F0390 Unspecified dementia without behavioral disturbance: Secondary | ICD-10-CM | POA: Diagnosis not present

## 2017-06-09 DIAGNOSIS — M1990 Unspecified osteoarthritis, unspecified site: Secondary | ICD-10-CM | POA: Diagnosis not present

## 2017-06-09 DIAGNOSIS — R197 Diarrhea, unspecified: Secondary | ICD-10-CM | POA: Diagnosis not present

## 2017-06-11 DIAGNOSIS — F0390 Unspecified dementia without behavioral disturbance: Secondary | ICD-10-CM | POA: Diagnosis not present

## 2017-06-11 DIAGNOSIS — I951 Orthostatic hypotension: Secondary | ICD-10-CM | POA: Diagnosis not present

## 2017-06-11 DIAGNOSIS — R197 Diarrhea, unspecified: Secondary | ICD-10-CM | POA: Diagnosis not present

## 2017-06-11 DIAGNOSIS — N390 Urinary tract infection, site not specified: Secondary | ICD-10-CM | POA: Diagnosis not present

## 2017-06-11 DIAGNOSIS — M1990 Unspecified osteoarthritis, unspecified site: Secondary | ICD-10-CM | POA: Diagnosis not present

## 2017-06-11 DIAGNOSIS — F329 Major depressive disorder, single episode, unspecified: Secondary | ICD-10-CM | POA: Diagnosis not present

## 2017-06-12 DIAGNOSIS — R197 Diarrhea, unspecified: Secondary | ICD-10-CM | POA: Diagnosis not present

## 2017-06-12 DIAGNOSIS — F329 Major depressive disorder, single episode, unspecified: Secondary | ICD-10-CM | POA: Diagnosis not present

## 2017-06-12 DIAGNOSIS — F0390 Unspecified dementia without behavioral disturbance: Secondary | ICD-10-CM | POA: Diagnosis not present

## 2017-06-12 DIAGNOSIS — M1990 Unspecified osteoarthritis, unspecified site: Secondary | ICD-10-CM | POA: Diagnosis not present

## 2017-06-12 DIAGNOSIS — I951 Orthostatic hypotension: Secondary | ICD-10-CM | POA: Diagnosis not present

## 2017-06-12 DIAGNOSIS — N390 Urinary tract infection, site not specified: Secondary | ICD-10-CM | POA: Diagnosis not present

## 2017-06-17 DIAGNOSIS — N390 Urinary tract infection, site not specified: Secondary | ICD-10-CM | POA: Diagnosis not present

## 2017-06-17 DIAGNOSIS — R197 Diarrhea, unspecified: Secondary | ICD-10-CM | POA: Diagnosis not present

## 2017-06-17 DIAGNOSIS — F0390 Unspecified dementia without behavioral disturbance: Secondary | ICD-10-CM | POA: Diagnosis not present

## 2017-06-17 DIAGNOSIS — M1990 Unspecified osteoarthritis, unspecified site: Secondary | ICD-10-CM | POA: Diagnosis not present

## 2017-06-17 DIAGNOSIS — I951 Orthostatic hypotension: Secondary | ICD-10-CM | POA: Diagnosis not present

## 2017-06-17 DIAGNOSIS — F329 Major depressive disorder, single episode, unspecified: Secondary | ICD-10-CM | POA: Diagnosis not present

## 2017-06-18 DIAGNOSIS — F329 Major depressive disorder, single episode, unspecified: Secondary | ICD-10-CM | POA: Diagnosis not present

## 2017-06-18 DIAGNOSIS — F0390 Unspecified dementia without behavioral disturbance: Secondary | ICD-10-CM | POA: Diagnosis not present

## 2017-06-18 DIAGNOSIS — R197 Diarrhea, unspecified: Secondary | ICD-10-CM | POA: Diagnosis not present

## 2017-06-18 DIAGNOSIS — M1990 Unspecified osteoarthritis, unspecified site: Secondary | ICD-10-CM | POA: Diagnosis not present

## 2017-06-18 DIAGNOSIS — N390 Urinary tract infection, site not specified: Secondary | ICD-10-CM | POA: Diagnosis not present

## 2017-06-18 DIAGNOSIS — I951 Orthostatic hypotension: Secondary | ICD-10-CM | POA: Diagnosis not present

## 2017-06-19 DIAGNOSIS — R197 Diarrhea, unspecified: Secondary | ICD-10-CM | POA: Diagnosis not present

## 2017-06-19 DIAGNOSIS — F0390 Unspecified dementia without behavioral disturbance: Secondary | ICD-10-CM | POA: Diagnosis not present

## 2017-06-19 DIAGNOSIS — N390 Urinary tract infection, site not specified: Secondary | ICD-10-CM | POA: Diagnosis not present

## 2017-06-19 DIAGNOSIS — I951 Orthostatic hypotension: Secondary | ICD-10-CM | POA: Diagnosis not present

## 2017-06-19 DIAGNOSIS — F329 Major depressive disorder, single episode, unspecified: Secondary | ICD-10-CM | POA: Diagnosis not present

## 2017-06-19 DIAGNOSIS — M1990 Unspecified osteoarthritis, unspecified site: Secondary | ICD-10-CM | POA: Diagnosis not present

## 2017-06-23 DIAGNOSIS — R197 Diarrhea, unspecified: Secondary | ICD-10-CM | POA: Diagnosis not present

## 2017-06-23 DIAGNOSIS — I951 Orthostatic hypotension: Secondary | ICD-10-CM | POA: Diagnosis not present

## 2017-06-23 DIAGNOSIS — N390 Urinary tract infection, site not specified: Secondary | ICD-10-CM | POA: Diagnosis not present

## 2017-06-23 DIAGNOSIS — M1990 Unspecified osteoarthritis, unspecified site: Secondary | ICD-10-CM | POA: Diagnosis not present

## 2017-06-23 DIAGNOSIS — F0390 Unspecified dementia without behavioral disturbance: Secondary | ICD-10-CM | POA: Diagnosis not present

## 2017-06-23 DIAGNOSIS — F329 Major depressive disorder, single episode, unspecified: Secondary | ICD-10-CM | POA: Diagnosis not present

## 2017-06-24 DIAGNOSIS — M1990 Unspecified osteoarthritis, unspecified site: Secondary | ICD-10-CM | POA: Diagnosis not present

## 2017-06-24 DIAGNOSIS — I951 Orthostatic hypotension: Secondary | ICD-10-CM | POA: Diagnosis not present

## 2017-06-24 DIAGNOSIS — R197 Diarrhea, unspecified: Secondary | ICD-10-CM | POA: Diagnosis not present

## 2017-06-24 DIAGNOSIS — F0390 Unspecified dementia without behavioral disturbance: Secondary | ICD-10-CM | POA: Diagnosis not present

## 2017-06-24 DIAGNOSIS — N390 Urinary tract infection, site not specified: Secondary | ICD-10-CM | POA: Diagnosis not present

## 2017-06-24 DIAGNOSIS — F329 Major depressive disorder, single episode, unspecified: Secondary | ICD-10-CM | POA: Diagnosis not present

## 2017-06-25 DIAGNOSIS — I951 Orthostatic hypotension: Secondary | ICD-10-CM | POA: Diagnosis not present

## 2017-06-25 DIAGNOSIS — F0390 Unspecified dementia without behavioral disturbance: Secondary | ICD-10-CM | POA: Diagnosis not present

## 2017-06-25 DIAGNOSIS — N390 Urinary tract infection, site not specified: Secondary | ICD-10-CM | POA: Diagnosis not present

## 2017-06-25 DIAGNOSIS — F329 Major depressive disorder, single episode, unspecified: Secondary | ICD-10-CM | POA: Diagnosis not present

## 2017-06-25 DIAGNOSIS — M1990 Unspecified osteoarthritis, unspecified site: Secondary | ICD-10-CM | POA: Diagnosis not present

## 2017-06-25 DIAGNOSIS — R197 Diarrhea, unspecified: Secondary | ICD-10-CM | POA: Diagnosis not present

## 2017-06-30 DIAGNOSIS — M1990 Unspecified osteoarthritis, unspecified site: Secondary | ICD-10-CM | POA: Diagnosis not present

## 2017-06-30 DIAGNOSIS — F329 Major depressive disorder, single episode, unspecified: Secondary | ICD-10-CM | POA: Diagnosis not present

## 2017-06-30 DIAGNOSIS — R197 Diarrhea, unspecified: Secondary | ICD-10-CM | POA: Diagnosis not present

## 2017-06-30 DIAGNOSIS — I951 Orthostatic hypotension: Secondary | ICD-10-CM | POA: Diagnosis not present

## 2017-06-30 DIAGNOSIS — F0390 Unspecified dementia without behavioral disturbance: Secondary | ICD-10-CM | POA: Diagnosis not present

## 2017-06-30 DIAGNOSIS — N390 Urinary tract infection, site not specified: Secondary | ICD-10-CM | POA: Diagnosis not present

## 2017-07-07 DIAGNOSIS — F329 Major depressive disorder, single episode, unspecified: Secondary | ICD-10-CM | POA: Diagnosis not present

## 2017-07-07 DIAGNOSIS — M1990 Unspecified osteoarthritis, unspecified site: Secondary | ICD-10-CM | POA: Diagnosis not present

## 2017-07-07 DIAGNOSIS — F0390 Unspecified dementia without behavioral disturbance: Secondary | ICD-10-CM | POA: Diagnosis not present

## 2017-07-07 DIAGNOSIS — N390 Urinary tract infection, site not specified: Secondary | ICD-10-CM | POA: Diagnosis not present

## 2017-07-07 DIAGNOSIS — I951 Orthostatic hypotension: Secondary | ICD-10-CM | POA: Diagnosis not present

## 2017-07-07 DIAGNOSIS — R197 Diarrhea, unspecified: Secondary | ICD-10-CM | POA: Diagnosis not present

## 2017-07-10 DIAGNOSIS — R609 Edema, unspecified: Secondary | ICD-10-CM | POA: Diagnosis not present

## 2017-07-10 DIAGNOSIS — R197 Diarrhea, unspecified: Secondary | ICD-10-CM | POA: Diagnosis not present

## 2017-07-10 DIAGNOSIS — F329 Major depressive disorder, single episode, unspecified: Secondary | ICD-10-CM | POA: Diagnosis not present

## 2017-07-10 DIAGNOSIS — N390 Urinary tract infection, site not specified: Secondary | ICD-10-CM | POA: Diagnosis not present

## 2017-07-10 DIAGNOSIS — M1990 Unspecified osteoarthritis, unspecified site: Secondary | ICD-10-CM | POA: Diagnosis not present

## 2017-07-10 DIAGNOSIS — I951 Orthostatic hypotension: Secondary | ICD-10-CM | POA: Diagnosis not present

## 2017-07-10 DIAGNOSIS — F0390 Unspecified dementia without behavioral disturbance: Secondary | ICD-10-CM | POA: Diagnosis not present

## 2017-07-10 DIAGNOSIS — R0989 Other specified symptoms and signs involving the circulatory and respiratory systems: Secondary | ICD-10-CM | POA: Diagnosis not present

## 2017-07-14 DIAGNOSIS — N390 Urinary tract infection, site not specified: Secondary | ICD-10-CM | POA: Diagnosis not present

## 2017-07-14 DIAGNOSIS — F0390 Unspecified dementia without behavioral disturbance: Secondary | ICD-10-CM | POA: Diagnosis not present

## 2017-07-14 DIAGNOSIS — R197 Diarrhea, unspecified: Secondary | ICD-10-CM | POA: Diagnosis not present

## 2017-07-14 DIAGNOSIS — M1990 Unspecified osteoarthritis, unspecified site: Secondary | ICD-10-CM | POA: Diagnosis not present

## 2017-07-14 DIAGNOSIS — F329 Major depressive disorder, single episode, unspecified: Secondary | ICD-10-CM | POA: Diagnosis not present

## 2017-07-14 DIAGNOSIS — I951 Orthostatic hypotension: Secondary | ICD-10-CM | POA: Diagnosis not present

## 2017-07-15 DIAGNOSIS — R197 Diarrhea, unspecified: Secondary | ICD-10-CM | POA: Diagnosis not present

## 2017-07-15 DIAGNOSIS — F0390 Unspecified dementia without behavioral disturbance: Secondary | ICD-10-CM | POA: Diagnosis not present

## 2017-07-15 DIAGNOSIS — F329 Major depressive disorder, single episode, unspecified: Secondary | ICD-10-CM | POA: Diagnosis not present

## 2017-07-15 DIAGNOSIS — N390 Urinary tract infection, site not specified: Secondary | ICD-10-CM | POA: Diagnosis not present

## 2017-07-15 DIAGNOSIS — I951 Orthostatic hypotension: Secondary | ICD-10-CM | POA: Diagnosis not present

## 2017-07-15 DIAGNOSIS — M1990 Unspecified osteoarthritis, unspecified site: Secondary | ICD-10-CM | POA: Diagnosis not present

## 2017-07-23 DIAGNOSIS — M79674 Pain in right toe(s): Secondary | ICD-10-CM | POA: Diagnosis not present

## 2017-07-23 DIAGNOSIS — L602 Onychogryphosis: Secondary | ICD-10-CM | POA: Diagnosis not present

## 2017-07-23 DIAGNOSIS — M79675 Pain in left toe(s): Secondary | ICD-10-CM | POA: Diagnosis not present

## 2017-07-23 DIAGNOSIS — I872 Venous insufficiency (chronic) (peripheral): Secondary | ICD-10-CM | POA: Diagnosis not present

## 2017-07-24 DIAGNOSIS — R197 Diarrhea, unspecified: Secondary | ICD-10-CM | POA: Diagnosis not present

## 2017-07-24 DIAGNOSIS — I951 Orthostatic hypotension: Secondary | ICD-10-CM | POA: Diagnosis not present

## 2017-07-24 DIAGNOSIS — M1990 Unspecified osteoarthritis, unspecified site: Secondary | ICD-10-CM | POA: Diagnosis not present

## 2017-07-24 DIAGNOSIS — F329 Major depressive disorder, single episode, unspecified: Secondary | ICD-10-CM | POA: Diagnosis not present

## 2017-07-24 DIAGNOSIS — N390 Urinary tract infection, site not specified: Secondary | ICD-10-CM | POA: Diagnosis not present

## 2017-07-24 DIAGNOSIS — F0390 Unspecified dementia without behavioral disturbance: Secondary | ICD-10-CM | POA: Diagnosis not present

## 2017-07-28 DIAGNOSIS — F0390 Unspecified dementia without behavioral disturbance: Secondary | ICD-10-CM | POA: Diagnosis not present

## 2017-07-28 DIAGNOSIS — N390 Urinary tract infection, site not specified: Secondary | ICD-10-CM | POA: Diagnosis not present

## 2017-07-28 DIAGNOSIS — R197 Diarrhea, unspecified: Secondary | ICD-10-CM | POA: Diagnosis not present

## 2017-07-28 DIAGNOSIS — I951 Orthostatic hypotension: Secondary | ICD-10-CM | POA: Diagnosis not present

## 2017-07-28 DIAGNOSIS — F329 Major depressive disorder, single episode, unspecified: Secondary | ICD-10-CM | POA: Diagnosis not present

## 2017-07-28 DIAGNOSIS — M1990 Unspecified osteoarthritis, unspecified site: Secondary | ICD-10-CM | POA: Diagnosis not present

## 2017-07-29 DIAGNOSIS — R197 Diarrhea, unspecified: Secondary | ICD-10-CM | POA: Diagnosis not present

## 2017-07-29 DIAGNOSIS — F329 Major depressive disorder, single episode, unspecified: Secondary | ICD-10-CM | POA: Diagnosis not present

## 2017-07-29 DIAGNOSIS — N390 Urinary tract infection, site not specified: Secondary | ICD-10-CM | POA: Diagnosis not present

## 2017-07-29 DIAGNOSIS — F0390 Unspecified dementia without behavioral disturbance: Secondary | ICD-10-CM | POA: Diagnosis not present

## 2017-07-29 DIAGNOSIS — M1990 Unspecified osteoarthritis, unspecified site: Secondary | ICD-10-CM | POA: Diagnosis not present

## 2017-07-29 DIAGNOSIS — I951 Orthostatic hypotension: Secondary | ICD-10-CM | POA: Diagnosis not present

## 2017-08-01 DIAGNOSIS — F329 Major depressive disorder, single episode, unspecified: Secondary | ICD-10-CM | POA: Diagnosis not present

## 2017-08-01 DIAGNOSIS — I951 Orthostatic hypotension: Secondary | ICD-10-CM | POA: Diagnosis not present

## 2017-08-01 DIAGNOSIS — M1990 Unspecified osteoarthritis, unspecified site: Secondary | ICD-10-CM | POA: Diagnosis not present

## 2017-08-01 DIAGNOSIS — Z87440 Personal history of urinary (tract) infections: Secondary | ICD-10-CM | POA: Diagnosis not present

## 2017-08-01 DIAGNOSIS — F0390 Unspecified dementia without behavioral disturbance: Secondary | ICD-10-CM | POA: Diagnosis not present

## 2017-08-01 DIAGNOSIS — Z9181 History of falling: Secondary | ICD-10-CM | POA: Diagnosis not present

## 2017-08-01 DIAGNOSIS — E039 Hypothyroidism, unspecified: Secondary | ICD-10-CM | POA: Diagnosis not present

## 2017-08-01 DIAGNOSIS — E785 Hyperlipidemia, unspecified: Secondary | ICD-10-CM | POA: Diagnosis not present

## 2017-08-06 DIAGNOSIS — M1990 Unspecified osteoarthritis, unspecified site: Secondary | ICD-10-CM | POA: Diagnosis not present

## 2017-08-06 DIAGNOSIS — F329 Major depressive disorder, single episode, unspecified: Secondary | ICD-10-CM | POA: Diagnosis not present

## 2017-08-06 DIAGNOSIS — F0390 Unspecified dementia without behavioral disturbance: Secondary | ICD-10-CM | POA: Diagnosis not present

## 2017-08-06 DIAGNOSIS — E039 Hypothyroidism, unspecified: Secondary | ICD-10-CM | POA: Diagnosis not present

## 2017-08-06 DIAGNOSIS — I951 Orthostatic hypotension: Secondary | ICD-10-CM | POA: Diagnosis not present

## 2017-08-06 DIAGNOSIS — E785 Hyperlipidemia, unspecified: Secondary | ICD-10-CM | POA: Diagnosis not present

## 2017-08-07 DIAGNOSIS — R600 Localized edema: Secondary | ICD-10-CM | POA: Diagnosis not present

## 2017-08-08 DIAGNOSIS — F0390 Unspecified dementia without behavioral disturbance: Secondary | ICD-10-CM | POA: Diagnosis not present

## 2017-08-08 DIAGNOSIS — E039 Hypothyroidism, unspecified: Secondary | ICD-10-CM | POA: Diagnosis not present

## 2017-08-08 DIAGNOSIS — M1990 Unspecified osteoarthritis, unspecified site: Secondary | ICD-10-CM | POA: Diagnosis not present

## 2017-08-08 DIAGNOSIS — E785 Hyperlipidemia, unspecified: Secondary | ICD-10-CM | POA: Diagnosis not present

## 2017-08-08 DIAGNOSIS — F329 Major depressive disorder, single episode, unspecified: Secondary | ICD-10-CM | POA: Diagnosis not present

## 2017-08-08 DIAGNOSIS — I951 Orthostatic hypotension: Secondary | ICD-10-CM | POA: Diagnosis not present

## 2017-08-11 DIAGNOSIS — F329 Major depressive disorder, single episode, unspecified: Secondary | ICD-10-CM | POA: Diagnosis not present

## 2017-08-11 DIAGNOSIS — M1990 Unspecified osteoarthritis, unspecified site: Secondary | ICD-10-CM | POA: Diagnosis not present

## 2017-08-11 DIAGNOSIS — I951 Orthostatic hypotension: Secondary | ICD-10-CM | POA: Diagnosis not present

## 2017-08-11 DIAGNOSIS — F0390 Unspecified dementia without behavioral disturbance: Secondary | ICD-10-CM | POA: Diagnosis not present

## 2017-08-11 DIAGNOSIS — E039 Hypothyroidism, unspecified: Secondary | ICD-10-CM | POA: Diagnosis not present

## 2017-08-11 DIAGNOSIS — E785 Hyperlipidemia, unspecified: Secondary | ICD-10-CM | POA: Diagnosis not present

## 2017-08-14 DIAGNOSIS — M1990 Unspecified osteoarthritis, unspecified site: Secondary | ICD-10-CM | POA: Diagnosis not present

## 2017-08-14 DIAGNOSIS — F329 Major depressive disorder, single episode, unspecified: Secondary | ICD-10-CM | POA: Diagnosis not present

## 2017-08-14 DIAGNOSIS — E785 Hyperlipidemia, unspecified: Secondary | ICD-10-CM | POA: Diagnosis not present

## 2017-08-14 DIAGNOSIS — I951 Orthostatic hypotension: Secondary | ICD-10-CM | POA: Diagnosis not present

## 2017-08-14 DIAGNOSIS — F0390 Unspecified dementia without behavioral disturbance: Secondary | ICD-10-CM | POA: Diagnosis not present

## 2017-08-14 DIAGNOSIS — E039 Hypothyroidism, unspecified: Secondary | ICD-10-CM | POA: Diagnosis not present

## 2017-08-18 DIAGNOSIS — E785 Hyperlipidemia, unspecified: Secondary | ICD-10-CM | POA: Diagnosis not present

## 2017-08-18 DIAGNOSIS — M1990 Unspecified osteoarthritis, unspecified site: Secondary | ICD-10-CM | POA: Diagnosis not present

## 2017-08-18 DIAGNOSIS — F0390 Unspecified dementia without behavioral disturbance: Secondary | ICD-10-CM | POA: Diagnosis not present

## 2017-08-18 DIAGNOSIS — F329 Major depressive disorder, single episode, unspecified: Secondary | ICD-10-CM | POA: Diagnosis not present

## 2017-08-18 DIAGNOSIS — I951 Orthostatic hypotension: Secondary | ICD-10-CM | POA: Diagnosis not present

## 2017-08-18 DIAGNOSIS — E039 Hypothyroidism, unspecified: Secondary | ICD-10-CM | POA: Diagnosis not present

## 2017-08-19 DIAGNOSIS — G8929 Other chronic pain: Secondary | ICD-10-CM | POA: Diagnosis not present

## 2017-08-19 DIAGNOSIS — M150 Primary generalized (osteo)arthritis: Secondary | ICD-10-CM | POA: Diagnosis not present

## 2017-08-19 DIAGNOSIS — R399 Unspecified symptoms and signs involving the genitourinary system: Secondary | ICD-10-CM | POA: Diagnosis not present

## 2017-08-19 DIAGNOSIS — R531 Weakness: Secondary | ICD-10-CM | POA: Diagnosis not present

## 2017-08-19 DIAGNOSIS — M25562 Pain in left knee: Secondary | ICD-10-CM | POA: Diagnosis not present

## 2017-08-19 DIAGNOSIS — M25572 Pain in left ankle and joints of left foot: Secondary | ICD-10-CM | POA: Diagnosis not present

## 2017-08-19 DIAGNOSIS — R609 Edema, unspecified: Secondary | ICD-10-CM | POA: Diagnosis not present

## 2017-08-19 DIAGNOSIS — W19XXXA Unspecified fall, initial encounter: Secondary | ICD-10-CM | POA: Diagnosis not present

## 2017-08-19 DIAGNOSIS — M25561 Pain in right knee: Secondary | ICD-10-CM | POA: Diagnosis not present

## 2017-08-19 DIAGNOSIS — M25571 Pain in right ankle and joints of right foot: Secondary | ICD-10-CM | POA: Diagnosis not present

## 2017-08-20 DIAGNOSIS — I951 Orthostatic hypotension: Secondary | ICD-10-CM | POA: Diagnosis not present

## 2017-08-20 DIAGNOSIS — M1990 Unspecified osteoarthritis, unspecified site: Secondary | ICD-10-CM | POA: Diagnosis not present

## 2017-08-20 DIAGNOSIS — E785 Hyperlipidemia, unspecified: Secondary | ICD-10-CM | POA: Diagnosis not present

## 2017-08-20 DIAGNOSIS — E039 Hypothyroidism, unspecified: Secondary | ICD-10-CM | POA: Diagnosis not present

## 2017-08-20 DIAGNOSIS — F329 Major depressive disorder, single episode, unspecified: Secondary | ICD-10-CM | POA: Diagnosis not present

## 2017-08-20 DIAGNOSIS — F0390 Unspecified dementia without behavioral disturbance: Secondary | ICD-10-CM | POA: Diagnosis not present

## 2017-08-26 DIAGNOSIS — E039 Hypothyroidism, unspecified: Secondary | ICD-10-CM | POA: Diagnosis not present

## 2017-08-26 DIAGNOSIS — I951 Orthostatic hypotension: Secondary | ICD-10-CM | POA: Diagnosis not present

## 2017-08-26 DIAGNOSIS — M1990 Unspecified osteoarthritis, unspecified site: Secondary | ICD-10-CM | POA: Diagnosis not present

## 2017-08-26 DIAGNOSIS — E785 Hyperlipidemia, unspecified: Secondary | ICD-10-CM | POA: Diagnosis not present

## 2017-08-26 DIAGNOSIS — F329 Major depressive disorder, single episode, unspecified: Secondary | ICD-10-CM | POA: Diagnosis not present

## 2017-08-26 DIAGNOSIS — F0390 Unspecified dementia without behavioral disturbance: Secondary | ICD-10-CM | POA: Diagnosis not present

## 2017-08-28 DIAGNOSIS — E039 Hypothyroidism, unspecified: Secondary | ICD-10-CM | POA: Diagnosis not present

## 2017-08-28 DIAGNOSIS — E785 Hyperlipidemia, unspecified: Secondary | ICD-10-CM | POA: Diagnosis not present

## 2017-08-28 DIAGNOSIS — M1990 Unspecified osteoarthritis, unspecified site: Secondary | ICD-10-CM | POA: Diagnosis not present

## 2017-08-28 DIAGNOSIS — F329 Major depressive disorder, single episode, unspecified: Secondary | ICD-10-CM | POA: Diagnosis not present

## 2017-08-28 DIAGNOSIS — I951 Orthostatic hypotension: Secondary | ICD-10-CM | POA: Diagnosis not present

## 2017-08-28 DIAGNOSIS — F0390 Unspecified dementia without behavioral disturbance: Secondary | ICD-10-CM | POA: Diagnosis not present

## 2017-09-02 DIAGNOSIS — R609 Edema, unspecified: Secondary | ICD-10-CM | POA: Diagnosis not present

## 2017-09-02 DIAGNOSIS — R739 Hyperglycemia, unspecified: Secondary | ICD-10-CM | POA: Diagnosis not present

## 2017-09-02 DIAGNOSIS — I10 Essential (primary) hypertension: Secondary | ICD-10-CM | POA: Diagnosis not present

## 2017-09-02 DIAGNOSIS — E7800 Pure hypercholesterolemia, unspecified: Secondary | ICD-10-CM | POA: Diagnosis not present

## 2017-09-03 DIAGNOSIS — I951 Orthostatic hypotension: Secondary | ICD-10-CM | POA: Diagnosis not present

## 2017-09-03 DIAGNOSIS — E785 Hyperlipidemia, unspecified: Secondary | ICD-10-CM | POA: Diagnosis not present

## 2017-09-03 DIAGNOSIS — F329 Major depressive disorder, single episode, unspecified: Secondary | ICD-10-CM | POA: Diagnosis not present

## 2017-09-03 DIAGNOSIS — M1990 Unspecified osteoarthritis, unspecified site: Secondary | ICD-10-CM | POA: Diagnosis not present

## 2017-09-03 DIAGNOSIS — E039 Hypothyroidism, unspecified: Secondary | ICD-10-CM | POA: Diagnosis not present

## 2017-09-03 DIAGNOSIS — F0390 Unspecified dementia without behavioral disturbance: Secondary | ICD-10-CM | POA: Diagnosis not present

## 2017-09-04 DIAGNOSIS — I951 Orthostatic hypotension: Secondary | ICD-10-CM | POA: Diagnosis not present

## 2017-09-04 DIAGNOSIS — E039 Hypothyroidism, unspecified: Secondary | ICD-10-CM | POA: Diagnosis not present

## 2017-09-04 DIAGNOSIS — E785 Hyperlipidemia, unspecified: Secondary | ICD-10-CM | POA: Diagnosis not present

## 2017-09-04 DIAGNOSIS — F0390 Unspecified dementia without behavioral disturbance: Secondary | ICD-10-CM | POA: Diagnosis not present

## 2017-09-04 DIAGNOSIS — M1990 Unspecified osteoarthritis, unspecified site: Secondary | ICD-10-CM | POA: Diagnosis not present

## 2017-09-04 DIAGNOSIS — F329 Major depressive disorder, single episode, unspecified: Secondary | ICD-10-CM | POA: Diagnosis not present

## 2017-09-10 DIAGNOSIS — E039 Hypothyroidism, unspecified: Secondary | ICD-10-CM | POA: Diagnosis not present

## 2017-09-10 DIAGNOSIS — F0390 Unspecified dementia without behavioral disturbance: Secondary | ICD-10-CM | POA: Diagnosis not present

## 2017-09-10 DIAGNOSIS — F329 Major depressive disorder, single episode, unspecified: Secondary | ICD-10-CM | POA: Diagnosis not present

## 2017-09-10 DIAGNOSIS — M1990 Unspecified osteoarthritis, unspecified site: Secondary | ICD-10-CM | POA: Diagnosis not present

## 2017-09-10 DIAGNOSIS — I951 Orthostatic hypotension: Secondary | ICD-10-CM | POA: Diagnosis not present

## 2017-09-10 DIAGNOSIS — E785 Hyperlipidemia, unspecified: Secondary | ICD-10-CM | POA: Diagnosis not present

## 2017-09-15 DIAGNOSIS — E7800 Pure hypercholesterolemia, unspecified: Secondary | ICD-10-CM | POA: Diagnosis not present

## 2017-09-15 DIAGNOSIS — R609 Edema, unspecified: Secondary | ICD-10-CM | POA: Diagnosis not present

## 2017-09-15 DIAGNOSIS — R739 Hyperglycemia, unspecified: Secondary | ICD-10-CM | POA: Diagnosis not present

## 2017-09-15 DIAGNOSIS — E039 Hypothyroidism, unspecified: Secondary | ICD-10-CM | POA: Diagnosis not present

## 2017-09-17 DIAGNOSIS — M1990 Unspecified osteoarthritis, unspecified site: Secondary | ICD-10-CM | POA: Diagnosis not present

## 2017-09-17 DIAGNOSIS — E039 Hypothyroidism, unspecified: Secondary | ICD-10-CM | POA: Diagnosis not present

## 2017-09-17 DIAGNOSIS — F329 Major depressive disorder, single episode, unspecified: Secondary | ICD-10-CM | POA: Diagnosis not present

## 2017-09-17 DIAGNOSIS — E785 Hyperlipidemia, unspecified: Secondary | ICD-10-CM | POA: Diagnosis not present

## 2017-09-17 DIAGNOSIS — I951 Orthostatic hypotension: Secondary | ICD-10-CM | POA: Diagnosis not present

## 2017-09-17 DIAGNOSIS — F0390 Unspecified dementia without behavioral disturbance: Secondary | ICD-10-CM | POA: Diagnosis not present

## 2017-09-18 DIAGNOSIS — I951 Orthostatic hypotension: Secondary | ICD-10-CM | POA: Diagnosis not present

## 2017-09-18 DIAGNOSIS — E039 Hypothyroidism, unspecified: Secondary | ICD-10-CM | POA: Diagnosis not present

## 2017-09-18 DIAGNOSIS — F329 Major depressive disorder, single episode, unspecified: Secondary | ICD-10-CM | POA: Diagnosis not present

## 2017-09-18 DIAGNOSIS — M1990 Unspecified osteoarthritis, unspecified site: Secondary | ICD-10-CM | POA: Diagnosis not present

## 2017-09-18 DIAGNOSIS — E785 Hyperlipidemia, unspecified: Secondary | ICD-10-CM | POA: Diagnosis not present

## 2017-09-18 DIAGNOSIS — F0390 Unspecified dementia without behavioral disturbance: Secondary | ICD-10-CM | POA: Diagnosis not present

## 2017-09-23 DIAGNOSIS — R269 Unspecified abnormalities of gait and mobility: Secondary | ICD-10-CM | POA: Diagnosis not present

## 2017-09-23 DIAGNOSIS — M1712 Unilateral primary osteoarthritis, left knee: Secondary | ICD-10-CM | POA: Diagnosis not present

## 2017-09-23 DIAGNOSIS — M1711 Unilateral primary osteoarthritis, right knee: Secondary | ICD-10-CM | POA: Diagnosis not present

## 2017-09-24 DIAGNOSIS — F329 Major depressive disorder, single episode, unspecified: Secondary | ICD-10-CM | POA: Diagnosis not present

## 2017-09-24 DIAGNOSIS — F0390 Unspecified dementia without behavioral disturbance: Secondary | ICD-10-CM | POA: Diagnosis not present

## 2017-09-24 DIAGNOSIS — E785 Hyperlipidemia, unspecified: Secondary | ICD-10-CM | POA: Diagnosis not present

## 2017-09-24 DIAGNOSIS — I951 Orthostatic hypotension: Secondary | ICD-10-CM | POA: Diagnosis not present

## 2017-09-24 DIAGNOSIS — M1990 Unspecified osteoarthritis, unspecified site: Secondary | ICD-10-CM | POA: Diagnosis not present

## 2017-09-24 DIAGNOSIS — E039 Hypothyroidism, unspecified: Secondary | ICD-10-CM | POA: Diagnosis not present

## 2017-09-25 DIAGNOSIS — F0390 Unspecified dementia without behavioral disturbance: Secondary | ICD-10-CM | POA: Diagnosis not present

## 2017-09-25 DIAGNOSIS — E039 Hypothyroidism, unspecified: Secondary | ICD-10-CM | POA: Diagnosis not present

## 2017-09-25 DIAGNOSIS — F329 Major depressive disorder, single episode, unspecified: Secondary | ICD-10-CM | POA: Diagnosis not present

## 2017-09-25 DIAGNOSIS — M1990 Unspecified osteoarthritis, unspecified site: Secondary | ICD-10-CM | POA: Diagnosis not present

## 2017-09-25 DIAGNOSIS — I951 Orthostatic hypotension: Secondary | ICD-10-CM | POA: Diagnosis not present

## 2017-09-25 DIAGNOSIS — E785 Hyperlipidemia, unspecified: Secondary | ICD-10-CM | POA: Diagnosis not present

## 2017-09-27 DIAGNOSIS — M545 Low back pain: Secondary | ICD-10-CM | POA: Diagnosis not present

## 2017-10-07 DIAGNOSIS — M25561 Pain in right knee: Secondary | ICD-10-CM | POA: Diagnosis not present

## 2017-10-07 DIAGNOSIS — M25569 Pain in unspecified knee: Secondary | ICD-10-CM | POA: Diagnosis not present

## 2017-10-07 DIAGNOSIS — M545 Low back pain: Secondary | ICD-10-CM | POA: Diagnosis not present

## 2017-10-07 DIAGNOSIS — M25562 Pain in left knee: Secondary | ICD-10-CM | POA: Diagnosis not present

## 2017-10-17 DIAGNOSIS — M545 Low back pain: Secondary | ICD-10-CM | POA: Diagnosis not present

## 2017-10-17 DIAGNOSIS — M4854XD Collapsed vertebra, not elsewhere classified, thoracic region, subsequent encounter for fracture with routine healing: Secondary | ICD-10-CM | POA: Diagnosis not present

## 2017-10-22 DIAGNOSIS — M545 Low back pain: Secondary | ICD-10-CM | POA: Diagnosis not present

## 2017-11-03 ENCOUNTER — Ambulatory Visit: Payer: Medicare Other | Admitting: Neurology

## 2017-11-03 DIAGNOSIS — M545 Low back pain: Secondary | ICD-10-CM | POA: Diagnosis not present

## 2017-11-14 DIAGNOSIS — M545 Low back pain: Secondary | ICD-10-CM | POA: Diagnosis not present

## 2017-11-17 DIAGNOSIS — M545 Low back pain: Secondary | ICD-10-CM | POA: Diagnosis not present

## 2017-11-20 ENCOUNTER — Ambulatory Visit: Payer: Medicare Other | Admitting: Neurology

## 2017-12-01 DIAGNOSIS — M545 Low back pain: Secondary | ICD-10-CM | POA: Diagnosis not present

## 2017-12-03 ENCOUNTER — Ambulatory Visit (INDEPENDENT_AMBULATORY_CARE_PROVIDER_SITE_OTHER): Payer: Medicare Other | Admitting: Neurology

## 2017-12-03 ENCOUNTER — Encounter: Payer: Self-pay | Admitting: Neurology

## 2017-12-03 VITALS — BP 128/75 | HR 85 | Wt 149.5 lb

## 2017-12-03 DIAGNOSIS — R413 Other amnesia: Secondary | ICD-10-CM

## 2017-12-03 DIAGNOSIS — R269 Unspecified abnormalities of gait and mobility: Secondary | ICD-10-CM | POA: Diagnosis not present
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
## 2017-12-15 DIAGNOSIS — E7800 Pure hypercholesterolemia, unspecified: Secondary | ICD-10-CM | POA: Diagnosis not present

## 2017-12-15 DIAGNOSIS — R739 Hyperglycemia, unspecified: Secondary | ICD-10-CM | POA: Diagnosis not present

## 2017-12-15 DIAGNOSIS — E039 Hypothyroidism, unspecified: Secondary | ICD-10-CM | POA: Diagnosis not present

## 2017-12-22 DIAGNOSIS — E7800 Pure hypercholesterolemia, unspecified: Secondary | ICD-10-CM | POA: Diagnosis not present

## 2017-12-22 DIAGNOSIS — R609 Edema, unspecified: Secondary | ICD-10-CM | POA: Diagnosis not present

## 2017-12-22 DIAGNOSIS — E039 Hypothyroidism, unspecified: Secondary | ICD-10-CM | POA: Diagnosis not present

## 2017-12-22 DIAGNOSIS — M545 Low back pain: Secondary | ICD-10-CM | POA: Diagnosis not present

## 2018-02-09 DIAGNOSIS — R399 Unspecified symptoms and signs involving the genitourinary system: Secondary | ICD-10-CM | POA: Diagnosis not present

## 2018-02-17 DIAGNOSIS — F0390 Unspecified dementia without behavioral disturbance: Secondary | ICD-10-CM | POA: Diagnosis not present

## 2018-04-03 DIAGNOSIS — E7800 Pure hypercholesterolemia, unspecified: Secondary | ICD-10-CM | POA: Diagnosis not present

## 2018-04-03 DIAGNOSIS — E039 Hypothyroidism, unspecified: Secondary | ICD-10-CM | POA: Diagnosis not present

## 2018-04-13 DIAGNOSIS — M4807 Spinal stenosis, lumbosacral region: Secondary | ICD-10-CM | POA: Diagnosis not present

## 2018-04-13 DIAGNOSIS — D81818 Other biotin-dependent carboxylase deficiency: Secondary | ICD-10-CM | POA: Diagnosis not present

## 2018-04-13 DIAGNOSIS — Z23 Encounter for immunization: Secondary | ICD-10-CM | POA: Diagnosis not present

## 2018-04-13 DIAGNOSIS — E039 Hypothyroidism, unspecified: Secondary | ICD-10-CM | POA: Diagnosis not present

## 2018-04-13 DIAGNOSIS — M545 Low back pain: Secondary | ICD-10-CM | POA: Diagnosis not present

## 2018-04-13 DIAGNOSIS — R609 Edema, unspecified: Secondary | ICD-10-CM | POA: Diagnosis not present

## 2018-04-13 DIAGNOSIS — F0390 Unspecified dementia without behavioral disturbance: Secondary | ICD-10-CM | POA: Diagnosis not present

## 2018-04-13 DIAGNOSIS — G1229 Other motor neuron disease: Secondary | ICD-10-CM | POA: Diagnosis not present

## 2018-04-13 DIAGNOSIS — L719 Rosacea, unspecified: Secondary | ICD-10-CM | POA: Diagnosis not present

## 2018-04-13 DIAGNOSIS — E7800 Pure hypercholesterolemia, unspecified: Secondary | ICD-10-CM | POA: Diagnosis not present

## 2018-04-13 DIAGNOSIS — R0989 Other specified symptoms and signs involving the circulatory and respiratory systems: Secondary | ICD-10-CM | POA: Diagnosis not present

## 2018-04-28 DIAGNOSIS — G309 Alzheimer's disease, unspecified: Secondary | ICD-10-CM | POA: Diagnosis not present

## 2018-04-28 DIAGNOSIS — F0280 Dementia in other diseases classified elsewhere without behavioral disturbance: Secondary | ICD-10-CM | POA: Diagnosis not present

## 2018-04-28 DIAGNOSIS — F329 Major depressive disorder, single episode, unspecified: Secondary | ICD-10-CM | POA: Diagnosis not present

## 2018-04-28 DIAGNOSIS — E039 Hypothyroidism, unspecified: Secondary | ICD-10-CM | POA: Diagnosis not present

## 2018-04-28 DIAGNOSIS — G1229 Other motor neuron disease: Secondary | ICD-10-CM | POA: Diagnosis not present

## 2018-04-28 DIAGNOSIS — Z9181 History of falling: Secondary | ICD-10-CM | POA: Diagnosis not present

## 2018-04-28 DIAGNOSIS — M4807 Spinal stenosis, lumbosacral region: Secondary | ICD-10-CM | POA: Diagnosis not present

## 2018-04-30 DIAGNOSIS — F329 Major depressive disorder, single episode, unspecified: Secondary | ICD-10-CM | POA: Diagnosis not present

## 2018-04-30 DIAGNOSIS — M4807 Spinal stenosis, lumbosacral region: Secondary | ICD-10-CM | POA: Diagnosis not present

## 2018-04-30 DIAGNOSIS — F0280 Dementia in other diseases classified elsewhere without behavioral disturbance: Secondary | ICD-10-CM | POA: Diagnosis not present

## 2018-04-30 DIAGNOSIS — G309 Alzheimer's disease, unspecified: Secondary | ICD-10-CM | POA: Diagnosis not present

## 2018-04-30 DIAGNOSIS — G1229 Other motor neuron disease: Secondary | ICD-10-CM | POA: Diagnosis not present

## 2018-04-30 DIAGNOSIS — E039 Hypothyroidism, unspecified: Secondary | ICD-10-CM | POA: Diagnosis not present

## 2018-05-01 DIAGNOSIS — G1229 Other motor neuron disease: Secondary | ICD-10-CM | POA: Diagnosis not present

## 2018-05-01 DIAGNOSIS — F0280 Dementia in other diseases classified elsewhere without behavioral disturbance: Secondary | ICD-10-CM | POA: Diagnosis not present

## 2018-05-01 DIAGNOSIS — G309 Alzheimer's disease, unspecified: Secondary | ICD-10-CM | POA: Diagnosis not present

## 2018-05-01 DIAGNOSIS — E039 Hypothyroidism, unspecified: Secondary | ICD-10-CM | POA: Diagnosis not present

## 2018-05-01 DIAGNOSIS — M4807 Spinal stenosis, lumbosacral region: Secondary | ICD-10-CM | POA: Diagnosis not present

## 2018-05-01 DIAGNOSIS — F329 Major depressive disorder, single episode, unspecified: Secondary | ICD-10-CM | POA: Diagnosis not present

## 2018-05-04 DIAGNOSIS — F0280 Dementia in other diseases classified elsewhere without behavioral disturbance: Secondary | ICD-10-CM | POA: Diagnosis not present

## 2018-05-04 DIAGNOSIS — G1229 Other motor neuron disease: Secondary | ICD-10-CM | POA: Diagnosis not present

## 2018-05-04 DIAGNOSIS — G309 Alzheimer's disease, unspecified: Secondary | ICD-10-CM | POA: Diagnosis not present

## 2018-05-04 DIAGNOSIS — F329 Major depressive disorder, single episode, unspecified: Secondary | ICD-10-CM | POA: Diagnosis not present

## 2018-05-04 DIAGNOSIS — E039 Hypothyroidism, unspecified: Secondary | ICD-10-CM | POA: Diagnosis not present

## 2018-05-04 DIAGNOSIS — M4807 Spinal stenosis, lumbosacral region: Secondary | ICD-10-CM | POA: Diagnosis not present

## 2018-05-05 DIAGNOSIS — F0280 Dementia in other diseases classified elsewhere without behavioral disturbance: Secondary | ICD-10-CM | POA: Diagnosis not present

## 2018-05-05 DIAGNOSIS — E039 Hypothyroidism, unspecified: Secondary | ICD-10-CM | POA: Diagnosis not present

## 2018-05-05 DIAGNOSIS — F329 Major depressive disorder, single episode, unspecified: Secondary | ICD-10-CM | POA: Diagnosis not present

## 2018-05-05 DIAGNOSIS — M4807 Spinal stenosis, lumbosacral region: Secondary | ICD-10-CM | POA: Diagnosis not present

## 2018-05-05 DIAGNOSIS — G1229 Other motor neuron disease: Secondary | ICD-10-CM | POA: Diagnosis not present

## 2018-05-05 DIAGNOSIS — G309 Alzheimer's disease, unspecified: Secondary | ICD-10-CM | POA: Diagnosis not present

## 2018-05-07 DIAGNOSIS — M4807 Spinal stenosis, lumbosacral region: Secondary | ICD-10-CM | POA: Diagnosis not present

## 2018-05-07 DIAGNOSIS — E039 Hypothyroidism, unspecified: Secondary | ICD-10-CM | POA: Diagnosis not present

## 2018-05-07 DIAGNOSIS — G1229 Other motor neuron disease: Secondary | ICD-10-CM | POA: Diagnosis not present

## 2018-05-07 DIAGNOSIS — F329 Major depressive disorder, single episode, unspecified: Secondary | ICD-10-CM | POA: Diagnosis not present

## 2018-05-07 DIAGNOSIS — G309 Alzheimer's disease, unspecified: Secondary | ICD-10-CM | POA: Diagnosis not present

## 2018-05-07 DIAGNOSIS — F0280 Dementia in other diseases classified elsewhere without behavioral disturbance: Secondary | ICD-10-CM | POA: Diagnosis not present

## 2018-05-11 DIAGNOSIS — M4807 Spinal stenosis, lumbosacral region: Secondary | ICD-10-CM | POA: Diagnosis not present

## 2018-05-11 DIAGNOSIS — G309 Alzheimer's disease, unspecified: Secondary | ICD-10-CM | POA: Diagnosis not present

## 2018-05-11 DIAGNOSIS — F0280 Dementia in other diseases classified elsewhere without behavioral disturbance: Secondary | ICD-10-CM | POA: Diagnosis not present

## 2018-05-11 DIAGNOSIS — E039 Hypothyroidism, unspecified: Secondary | ICD-10-CM | POA: Diagnosis not present

## 2018-05-11 DIAGNOSIS — G1229 Other motor neuron disease: Secondary | ICD-10-CM | POA: Diagnosis not present

## 2018-05-11 DIAGNOSIS — F329 Major depressive disorder, single episode, unspecified: Secondary | ICD-10-CM | POA: Diagnosis not present

## 2018-05-12 DIAGNOSIS — G1229 Other motor neuron disease: Secondary | ICD-10-CM | POA: Diagnosis not present

## 2018-05-12 DIAGNOSIS — R609 Edema, unspecified: Secondary | ICD-10-CM | POA: Diagnosis not present

## 2018-05-12 DIAGNOSIS — M4807 Spinal stenosis, lumbosacral region: Secondary | ICD-10-CM | POA: Diagnosis not present

## 2018-05-12 DIAGNOSIS — G309 Alzheimer's disease, unspecified: Secondary | ICD-10-CM | POA: Diagnosis not present

## 2018-05-12 DIAGNOSIS — E039 Hypothyroidism, unspecified: Secondary | ICD-10-CM | POA: Diagnosis not present

## 2018-05-12 DIAGNOSIS — F329 Major depressive disorder, single episode, unspecified: Secondary | ICD-10-CM | POA: Diagnosis not present

## 2018-05-12 DIAGNOSIS — R062 Wheezing: Secondary | ICD-10-CM | POA: Diagnosis not present

## 2018-05-12 DIAGNOSIS — F0280 Dementia in other diseases classified elsewhere without behavioral disturbance: Secondary | ICD-10-CM | POA: Diagnosis not present

## 2018-05-12 DIAGNOSIS — R32 Unspecified urinary incontinence: Secondary | ICD-10-CM | POA: Diagnosis not present

## 2018-05-14 DIAGNOSIS — G309 Alzheimer's disease, unspecified: Secondary | ICD-10-CM | POA: Diagnosis not present

## 2018-05-14 DIAGNOSIS — F0280 Dementia in other diseases classified elsewhere without behavioral disturbance: Secondary | ICD-10-CM | POA: Diagnosis not present

## 2018-05-14 DIAGNOSIS — M4807 Spinal stenosis, lumbosacral region: Secondary | ICD-10-CM | POA: Diagnosis not present

## 2018-05-14 DIAGNOSIS — G1229 Other motor neuron disease: Secondary | ICD-10-CM | POA: Diagnosis not present

## 2018-05-14 DIAGNOSIS — F329 Major depressive disorder, single episode, unspecified: Secondary | ICD-10-CM | POA: Diagnosis not present

## 2018-05-14 DIAGNOSIS — E039 Hypothyroidism, unspecified: Secondary | ICD-10-CM | POA: Diagnosis not present

## 2018-05-15 DIAGNOSIS — G1229 Other motor neuron disease: Secondary | ICD-10-CM | POA: Diagnosis not present

## 2018-05-15 DIAGNOSIS — M4807 Spinal stenosis, lumbosacral region: Secondary | ICD-10-CM | POA: Diagnosis not present

## 2018-05-15 DIAGNOSIS — E039 Hypothyroidism, unspecified: Secondary | ICD-10-CM | POA: Diagnosis not present

## 2018-05-15 DIAGNOSIS — F329 Major depressive disorder, single episode, unspecified: Secondary | ICD-10-CM | POA: Diagnosis not present

## 2018-05-15 DIAGNOSIS — G309 Alzheimer's disease, unspecified: Secondary | ICD-10-CM | POA: Diagnosis not present

## 2018-05-15 DIAGNOSIS — F0280 Dementia in other diseases classified elsewhere without behavioral disturbance: Secondary | ICD-10-CM | POA: Diagnosis not present

## 2018-05-19 DIAGNOSIS — G309 Alzheimer's disease, unspecified: Secondary | ICD-10-CM | POA: Diagnosis not present

## 2018-05-19 DIAGNOSIS — F329 Major depressive disorder, single episode, unspecified: Secondary | ICD-10-CM | POA: Diagnosis not present

## 2018-05-19 DIAGNOSIS — M4807 Spinal stenosis, lumbosacral region: Secondary | ICD-10-CM | POA: Diagnosis not present

## 2018-05-19 DIAGNOSIS — F0280 Dementia in other diseases classified elsewhere without behavioral disturbance: Secondary | ICD-10-CM | POA: Diagnosis not present

## 2018-05-19 DIAGNOSIS — E039 Hypothyroidism, unspecified: Secondary | ICD-10-CM | POA: Diagnosis not present

## 2018-05-19 DIAGNOSIS — G1229 Other motor neuron disease: Secondary | ICD-10-CM | POA: Diagnosis not present

## 2018-05-20 DIAGNOSIS — F329 Major depressive disorder, single episode, unspecified: Secondary | ICD-10-CM | POA: Diagnosis not present

## 2018-05-20 DIAGNOSIS — E039 Hypothyroidism, unspecified: Secondary | ICD-10-CM | POA: Diagnosis not present

## 2018-05-20 DIAGNOSIS — M4807 Spinal stenosis, lumbosacral region: Secondary | ICD-10-CM | POA: Diagnosis not present

## 2018-05-20 DIAGNOSIS — G1229 Other motor neuron disease: Secondary | ICD-10-CM | POA: Diagnosis not present

## 2018-05-20 DIAGNOSIS — G309 Alzheimer's disease, unspecified: Secondary | ICD-10-CM | POA: Diagnosis not present

## 2018-05-20 DIAGNOSIS — F0280 Dementia in other diseases classified elsewhere without behavioral disturbance: Secondary | ICD-10-CM | POA: Diagnosis not present

## 2018-05-21 DIAGNOSIS — F0280 Dementia in other diseases classified elsewhere without behavioral disturbance: Secondary | ICD-10-CM | POA: Diagnosis not present

## 2018-05-21 DIAGNOSIS — F329 Major depressive disorder, single episode, unspecified: Secondary | ICD-10-CM | POA: Diagnosis not present

## 2018-05-21 DIAGNOSIS — G1229 Other motor neuron disease: Secondary | ICD-10-CM | POA: Diagnosis not present

## 2018-05-21 DIAGNOSIS — E039 Hypothyroidism, unspecified: Secondary | ICD-10-CM | POA: Diagnosis not present

## 2018-05-21 DIAGNOSIS — M4807 Spinal stenosis, lumbosacral region: Secondary | ICD-10-CM | POA: Diagnosis not present

## 2018-05-21 DIAGNOSIS — G309 Alzheimer's disease, unspecified: Secondary | ICD-10-CM | POA: Diagnosis not present

## 2018-05-22 DIAGNOSIS — G1229 Other motor neuron disease: Secondary | ICD-10-CM | POA: Diagnosis not present

## 2018-05-22 DIAGNOSIS — M4807 Spinal stenosis, lumbosacral region: Secondary | ICD-10-CM | POA: Diagnosis not present

## 2018-05-22 DIAGNOSIS — F0280 Dementia in other diseases classified elsewhere without behavioral disturbance: Secondary | ICD-10-CM | POA: Diagnosis not present

## 2018-05-22 DIAGNOSIS — G309 Alzheimer's disease, unspecified: Secondary | ICD-10-CM | POA: Diagnosis not present

## 2018-05-22 DIAGNOSIS — F329 Major depressive disorder, single episode, unspecified: Secondary | ICD-10-CM | POA: Diagnosis not present

## 2018-05-22 DIAGNOSIS — E039 Hypothyroidism, unspecified: Secondary | ICD-10-CM | POA: Diagnosis not present

## 2018-05-26 DIAGNOSIS — G309 Alzheimer's disease, unspecified: Secondary | ICD-10-CM | POA: Diagnosis not present

## 2018-05-26 DIAGNOSIS — M4807 Spinal stenosis, lumbosacral region: Secondary | ICD-10-CM | POA: Diagnosis not present

## 2018-05-26 DIAGNOSIS — E039 Hypothyroidism, unspecified: Secondary | ICD-10-CM | POA: Diagnosis not present

## 2018-05-26 DIAGNOSIS — F329 Major depressive disorder, single episode, unspecified: Secondary | ICD-10-CM | POA: Diagnosis not present

## 2018-05-26 DIAGNOSIS — G1229 Other motor neuron disease: Secondary | ICD-10-CM | POA: Diagnosis not present

## 2018-05-26 DIAGNOSIS — F0280 Dementia in other diseases classified elsewhere without behavioral disturbance: Secondary | ICD-10-CM | POA: Diagnosis not present

## 2018-05-27 DIAGNOSIS — F0280 Dementia in other diseases classified elsewhere without behavioral disturbance: Secondary | ICD-10-CM | POA: Diagnosis not present

## 2018-05-27 DIAGNOSIS — F329 Major depressive disorder, single episode, unspecified: Secondary | ICD-10-CM | POA: Diagnosis not present

## 2018-05-27 DIAGNOSIS — E039 Hypothyroidism, unspecified: Secondary | ICD-10-CM | POA: Diagnosis not present

## 2018-05-27 DIAGNOSIS — G1229 Other motor neuron disease: Secondary | ICD-10-CM | POA: Diagnosis not present

## 2018-05-27 DIAGNOSIS — M4807 Spinal stenosis, lumbosacral region: Secondary | ICD-10-CM | POA: Diagnosis not present

## 2018-05-27 DIAGNOSIS — G309 Alzheimer's disease, unspecified: Secondary | ICD-10-CM | POA: Diagnosis not present

## 2018-05-28 DIAGNOSIS — E039 Hypothyroidism, unspecified: Secondary | ICD-10-CM | POA: Diagnosis not present

## 2018-05-28 DIAGNOSIS — M4807 Spinal stenosis, lumbosacral region: Secondary | ICD-10-CM | POA: Diagnosis not present

## 2018-05-28 DIAGNOSIS — G1229 Other motor neuron disease: Secondary | ICD-10-CM | POA: Diagnosis not present

## 2018-05-28 DIAGNOSIS — F0280 Dementia in other diseases classified elsewhere without behavioral disturbance: Secondary | ICD-10-CM | POA: Diagnosis not present

## 2018-05-28 DIAGNOSIS — G309 Alzheimer's disease, unspecified: Secondary | ICD-10-CM | POA: Diagnosis not present

## 2018-05-28 DIAGNOSIS — F329 Major depressive disorder, single episode, unspecified: Secondary | ICD-10-CM | POA: Diagnosis not present

## 2018-05-29 DIAGNOSIS — M4807 Spinal stenosis, lumbosacral region: Secondary | ICD-10-CM | POA: Diagnosis not present

## 2018-05-29 DIAGNOSIS — E039 Hypothyroidism, unspecified: Secondary | ICD-10-CM | POA: Diagnosis not present

## 2018-05-29 DIAGNOSIS — G309 Alzheimer's disease, unspecified: Secondary | ICD-10-CM | POA: Diagnosis not present

## 2018-05-29 DIAGNOSIS — F0280 Dementia in other diseases classified elsewhere without behavioral disturbance: Secondary | ICD-10-CM | POA: Diagnosis not present

## 2018-05-29 DIAGNOSIS — F329 Major depressive disorder, single episode, unspecified: Secondary | ICD-10-CM | POA: Diagnosis not present

## 2018-05-29 DIAGNOSIS — G1229 Other motor neuron disease: Secondary | ICD-10-CM | POA: Diagnosis not present

## 2018-05-31 DIAGNOSIS — W19XXXA Unspecified fall, initial encounter: Secondary | ICD-10-CM | POA: Diagnosis not present

## 2018-05-31 DIAGNOSIS — M25552 Pain in left hip: Secondary | ICD-10-CM | POA: Diagnosis not present

## 2018-06-01 ENCOUNTER — Encounter (HOSPITAL_COMMUNITY): Payer: Self-pay | Admitting: *Deleted

## 2018-06-01 ENCOUNTER — Other Ambulatory Visit: Payer: Self-pay

## 2018-06-01 ENCOUNTER — Emergency Department (HOSPITAL_COMMUNITY)
Admission: EM | Admit: 2018-06-01 | Discharge: 2018-06-01 | Disposition: A | Discharge status: Home / Self Care | Payer: Medicare Other | Attending: Emergency Medicine | Admitting: Emergency Medicine

## 2018-06-01 DIAGNOSIS — F0280 Dementia in other diseases classified elsewhere without behavioral disturbance: Secondary | ICD-10-CM | POA: Diagnosis not present

## 2018-06-01 DIAGNOSIS — G1229 Other motor neuron disease: Secondary | ICD-10-CM | POA: Diagnosis not present

## 2018-06-01 DIAGNOSIS — F329 Major depressive disorder, single episode, unspecified: Secondary | ICD-10-CM | POA: Diagnosis not present

## 2018-06-01 DIAGNOSIS — M4807 Spinal stenosis, lumbosacral region: Secondary | ICD-10-CM | POA: Diagnosis not present

## 2018-06-01 DIAGNOSIS — Z5321 Procedure and treatment not carried out due to patient leaving prior to being seen by health care provider: Secondary | ICD-10-CM | POA: Insufficient documentation

## 2018-06-01 DIAGNOSIS — E039 Hypothyroidism, unspecified: Secondary | ICD-10-CM | POA: Diagnosis not present

## 2018-06-01 DIAGNOSIS — G309 Alzheimer's disease, unspecified: Secondary | ICD-10-CM | POA: Diagnosis not present

## 2018-06-01 DIAGNOSIS — M549 Dorsalgia, unspecified: Secondary | ICD-10-CM | POA: Diagnosis not present

## 2018-06-01 NOTE — ED Triage Notes (Signed)
Pt fell Sat went to [facility] ahd had back and hip x-rays (negative). [facility] again this am. Pt states she has bruises only. Also has "bad bruise" on back. Bil leg and feet edema which is chronic. Person with her is poor historian.

## 2018-06-02 DIAGNOSIS — F329 Major depressive disorder, single episode, unspecified: Secondary | ICD-10-CM | POA: Diagnosis not present

## 2018-06-02 DIAGNOSIS — F0280 Dementia in other diseases classified elsewhere without behavioral disturbance: Secondary | ICD-10-CM | POA: Diagnosis not present

## 2018-06-02 DIAGNOSIS — G1229 Other motor neuron disease: Secondary | ICD-10-CM | POA: Diagnosis not present

## 2018-06-02 DIAGNOSIS — E039 Hypothyroidism, unspecified: Secondary | ICD-10-CM | POA: Diagnosis not present

## 2018-06-02 DIAGNOSIS — G309 Alzheimer's disease, unspecified: Secondary | ICD-10-CM | POA: Diagnosis not present

## 2018-06-02 DIAGNOSIS — M4807 Spinal stenosis, lumbosacral region: Secondary | ICD-10-CM | POA: Diagnosis not present

## 2018-06-03 DIAGNOSIS — E039 Hypothyroidism, unspecified: Secondary | ICD-10-CM | POA: Diagnosis not present

## 2018-06-03 DIAGNOSIS — F329 Major depressive disorder, single episode, unspecified: Secondary | ICD-10-CM | POA: Diagnosis not present

## 2018-06-03 DIAGNOSIS — M4807 Spinal stenosis, lumbosacral region: Secondary | ICD-10-CM | POA: Diagnosis not present

## 2018-06-03 DIAGNOSIS — F0280 Dementia in other diseases classified elsewhere without behavioral disturbance: Secondary | ICD-10-CM | POA: Diagnosis not present

## 2018-06-03 DIAGNOSIS — G309 Alzheimer's disease, unspecified: Secondary | ICD-10-CM | POA: Diagnosis not present

## 2018-06-03 DIAGNOSIS — G1229 Other motor neuron disease: Secondary | ICD-10-CM | POA: Diagnosis not present

## 2018-06-04 DIAGNOSIS — M4807 Spinal stenosis, lumbosacral region: Secondary | ICD-10-CM | POA: Diagnosis not present

## 2018-06-04 DIAGNOSIS — F0390 Unspecified dementia without behavioral disturbance: Secondary | ICD-10-CM | POA: Diagnosis not present

## 2018-06-04 DIAGNOSIS — R296 Repeated falls: Secondary | ICD-10-CM | POA: Diagnosis not present

## 2018-06-05 DIAGNOSIS — E039 Hypothyroidism, unspecified: Secondary | ICD-10-CM | POA: Diagnosis not present

## 2018-06-05 DIAGNOSIS — G1229 Other motor neuron disease: Secondary | ICD-10-CM | POA: Diagnosis not present

## 2018-06-05 DIAGNOSIS — F329 Major depressive disorder, single episode, unspecified: Secondary | ICD-10-CM | POA: Diagnosis not present

## 2018-06-05 DIAGNOSIS — G309 Alzheimer's disease, unspecified: Secondary | ICD-10-CM | POA: Diagnosis not present

## 2018-06-05 DIAGNOSIS — F0280 Dementia in other diseases classified elsewhere without behavioral disturbance: Secondary | ICD-10-CM | POA: Diagnosis not present

## 2018-06-05 DIAGNOSIS — M4807 Spinal stenosis, lumbosacral region: Secondary | ICD-10-CM | POA: Diagnosis not present

## 2018-06-10 DIAGNOSIS — G309 Alzheimer's disease, unspecified: Secondary | ICD-10-CM | POA: Diagnosis not present

## 2018-06-10 DIAGNOSIS — M4807 Spinal stenosis, lumbosacral region: Secondary | ICD-10-CM | POA: Diagnosis not present

## 2018-06-10 DIAGNOSIS — F329 Major depressive disorder, single episode, unspecified: Secondary | ICD-10-CM | POA: Diagnosis not present

## 2018-06-10 DIAGNOSIS — G1229 Other motor neuron disease: Secondary | ICD-10-CM | POA: Diagnosis not present

## 2018-06-10 DIAGNOSIS — F0280 Dementia in other diseases classified elsewhere without behavioral disturbance: Secondary | ICD-10-CM | POA: Diagnosis not present

## 2018-06-10 DIAGNOSIS — E039 Hypothyroidism, unspecified: Secondary | ICD-10-CM | POA: Diagnosis not present

## 2018-06-11 DIAGNOSIS — G309 Alzheimer's disease, unspecified: Secondary | ICD-10-CM | POA: Diagnosis not present

## 2018-06-11 DIAGNOSIS — F329 Major depressive disorder, single episode, unspecified: Secondary | ICD-10-CM | POA: Diagnosis not present

## 2018-06-11 DIAGNOSIS — M4807 Spinal stenosis, lumbosacral region: Secondary | ICD-10-CM | POA: Diagnosis not present

## 2018-06-11 DIAGNOSIS — G1229 Other motor neuron disease: Secondary | ICD-10-CM | POA: Diagnosis not present

## 2018-06-11 DIAGNOSIS — E039 Hypothyroidism, unspecified: Secondary | ICD-10-CM | POA: Diagnosis not present

## 2018-06-11 DIAGNOSIS — F0280 Dementia in other diseases classified elsewhere without behavioral disturbance: Secondary | ICD-10-CM | POA: Diagnosis not present

## 2018-06-12 DIAGNOSIS — E039 Hypothyroidism, unspecified: Secondary | ICD-10-CM | POA: Diagnosis not present

## 2018-06-12 DIAGNOSIS — F329 Major depressive disorder, single episode, unspecified: Secondary | ICD-10-CM | POA: Diagnosis not present

## 2018-06-12 DIAGNOSIS — G309 Alzheimer's disease, unspecified: Secondary | ICD-10-CM | POA: Diagnosis not present

## 2018-06-12 DIAGNOSIS — G1229 Other motor neuron disease: Secondary | ICD-10-CM | POA: Diagnosis not present

## 2018-06-12 DIAGNOSIS — M4807 Spinal stenosis, lumbosacral region: Secondary | ICD-10-CM | POA: Diagnosis not present

## 2018-06-12 DIAGNOSIS — F0280 Dementia in other diseases classified elsewhere without behavioral disturbance: Secondary | ICD-10-CM | POA: Diagnosis not present

## 2018-06-16 DIAGNOSIS — G309 Alzheimer's disease, unspecified: Secondary | ICD-10-CM | POA: Diagnosis not present

## 2018-06-16 DIAGNOSIS — G1229 Other motor neuron disease: Secondary | ICD-10-CM | POA: Diagnosis not present

## 2018-06-16 DIAGNOSIS — F329 Major depressive disorder, single episode, unspecified: Secondary | ICD-10-CM | POA: Diagnosis not present

## 2018-06-16 DIAGNOSIS — M4807 Spinal stenosis, lumbosacral region: Secondary | ICD-10-CM | POA: Diagnosis not present

## 2018-06-16 DIAGNOSIS — E039 Hypothyroidism, unspecified: Secondary | ICD-10-CM | POA: Diagnosis not present

## 2018-06-16 DIAGNOSIS — F0280 Dementia in other diseases classified elsewhere without behavioral disturbance: Secondary | ICD-10-CM | POA: Diagnosis not present

## 2018-06-18 DIAGNOSIS — G309 Alzheimer's disease, unspecified: Secondary | ICD-10-CM | POA: Diagnosis not present

## 2018-06-18 DIAGNOSIS — M4807 Spinal stenosis, lumbosacral region: Secondary | ICD-10-CM | POA: Diagnosis not present

## 2018-06-18 DIAGNOSIS — F0280 Dementia in other diseases classified elsewhere without behavioral disturbance: Secondary | ICD-10-CM | POA: Diagnosis not present

## 2018-06-18 DIAGNOSIS — E039 Hypothyroidism, unspecified: Secondary | ICD-10-CM | POA: Diagnosis not present

## 2018-06-18 DIAGNOSIS — G1229 Other motor neuron disease: Secondary | ICD-10-CM | POA: Diagnosis not present

## 2018-06-18 DIAGNOSIS — F329 Major depressive disorder, single episode, unspecified: Secondary | ICD-10-CM | POA: Diagnosis not present

## 2018-06-19 DIAGNOSIS — M4807 Spinal stenosis, lumbosacral region: Secondary | ICD-10-CM | POA: Diagnosis not present

## 2018-06-19 DIAGNOSIS — Z111 Encounter for screening for respiratory tuberculosis: Secondary | ICD-10-CM | POA: Diagnosis not present

## 2018-06-19 DIAGNOSIS — G1229 Other motor neuron disease: Secondary | ICD-10-CM | POA: Diagnosis not present

## 2018-06-19 DIAGNOSIS — F329 Major depressive disorder, single episode, unspecified: Secondary | ICD-10-CM | POA: Diagnosis not present

## 2018-06-19 DIAGNOSIS — E039 Hypothyroidism, unspecified: Secondary | ICD-10-CM | POA: Diagnosis not present

## 2018-06-19 DIAGNOSIS — G309 Alzheimer's disease, unspecified: Secondary | ICD-10-CM | POA: Diagnosis not present

## 2018-06-19 DIAGNOSIS — F0280 Dementia in other diseases classified elsewhere without behavioral disturbance: Secondary | ICD-10-CM | POA: Diagnosis not present

## 2018-06-24 DIAGNOSIS — F329 Major depressive disorder, single episode, unspecified: Secondary | ICD-10-CM | POA: Diagnosis not present

## 2018-06-24 DIAGNOSIS — M4807 Spinal stenosis, lumbosacral region: Secondary | ICD-10-CM | POA: Diagnosis not present

## 2018-06-24 DIAGNOSIS — E039 Hypothyroidism, unspecified: Secondary | ICD-10-CM | POA: Diagnosis not present

## 2018-06-24 DIAGNOSIS — F0280 Dementia in other diseases classified elsewhere without behavioral disturbance: Secondary | ICD-10-CM | POA: Diagnosis not present

## 2018-06-24 DIAGNOSIS — G309 Alzheimer's disease, unspecified: Secondary | ICD-10-CM | POA: Diagnosis not present

## 2018-06-24 DIAGNOSIS — G1229 Other motor neuron disease: Secondary | ICD-10-CM | POA: Diagnosis not present

## 2018-06-26 DIAGNOSIS — G309 Alzheimer's disease, unspecified: Secondary | ICD-10-CM | POA: Diagnosis not present

## 2018-06-26 DIAGNOSIS — G1229 Other motor neuron disease: Secondary | ICD-10-CM | POA: Diagnosis not present

## 2018-06-26 DIAGNOSIS — F0280 Dementia in other diseases classified elsewhere without behavioral disturbance: Secondary | ICD-10-CM | POA: Diagnosis not present

## 2018-06-26 DIAGNOSIS — F329 Major depressive disorder, single episode, unspecified: Secondary | ICD-10-CM | POA: Diagnosis not present

## 2018-06-26 DIAGNOSIS — E039 Hypothyroidism, unspecified: Secondary | ICD-10-CM | POA: Diagnosis not present

## 2018-06-26 DIAGNOSIS — M4807 Spinal stenosis, lumbosacral region: Secondary | ICD-10-CM | POA: Diagnosis not present

## 2018-07-07 DIAGNOSIS — K529 Noninfective gastroenteritis and colitis, unspecified: Secondary | ICD-10-CM | POA: Diagnosis not present

## 2018-07-07 DIAGNOSIS — M1990 Unspecified osteoarthritis, unspecified site: Secondary | ICD-10-CM | POA: Diagnosis not present

## 2018-07-07 DIAGNOSIS — R399 Unspecified symptoms and signs involving the genitourinary system: Secondary | ICD-10-CM | POA: Diagnosis not present

## 2018-07-07 DIAGNOSIS — Z9181 History of falling: Secondary | ICD-10-CM | POA: Diagnosis not present

## 2018-07-07 DIAGNOSIS — E039 Hypothyroidism, unspecified: Secondary | ICD-10-CM | POA: Diagnosis not present

## 2018-07-07 DIAGNOSIS — M545 Low back pain: Secondary | ICD-10-CM | POA: Diagnosis not present

## 2018-07-07 DIAGNOSIS — F0280 Dementia in other diseases classified elsewhere without behavioral disturbance: Secondary | ICD-10-CM | POA: Diagnosis not present

## 2018-07-07 DIAGNOSIS — F329 Major depressive disorder, single episode, unspecified: Secondary | ICD-10-CM | POA: Diagnosis not present

## 2018-07-10 DIAGNOSIS — F329 Major depressive disorder, single episode, unspecified: Secondary | ICD-10-CM | POA: Diagnosis not present

## 2018-07-10 DIAGNOSIS — M545 Low back pain: Secondary | ICD-10-CM | POA: Diagnosis not present

## 2018-07-10 DIAGNOSIS — K529 Noninfective gastroenteritis and colitis, unspecified: Secondary | ICD-10-CM | POA: Diagnosis not present

## 2018-07-10 DIAGNOSIS — M1990 Unspecified osteoarthritis, unspecified site: Secondary | ICD-10-CM | POA: Diagnosis not present

## 2018-07-10 DIAGNOSIS — E039 Hypothyroidism, unspecified: Secondary | ICD-10-CM | POA: Diagnosis not present

## 2018-07-10 DIAGNOSIS — F0280 Dementia in other diseases classified elsewhere without behavioral disturbance: Secondary | ICD-10-CM | POA: Diagnosis not present

## 2018-07-13 DIAGNOSIS — E039 Hypothyroidism, unspecified: Secondary | ICD-10-CM | POA: Diagnosis not present

## 2018-07-13 DIAGNOSIS — F0280 Dementia in other diseases classified elsewhere without behavioral disturbance: Secondary | ICD-10-CM | POA: Diagnosis not present

## 2018-07-13 DIAGNOSIS — M545 Low back pain: Secondary | ICD-10-CM | POA: Diagnosis not present

## 2018-07-13 DIAGNOSIS — K529 Noninfective gastroenteritis and colitis, unspecified: Secondary | ICD-10-CM | POA: Diagnosis not present

## 2018-07-13 DIAGNOSIS — M1990 Unspecified osteoarthritis, unspecified site: Secondary | ICD-10-CM | POA: Diagnosis not present

## 2018-07-13 DIAGNOSIS — F329 Major depressive disorder, single episode, unspecified: Secondary | ICD-10-CM | POA: Diagnosis not present

## 2018-07-15 DIAGNOSIS — E039 Hypothyroidism, unspecified: Secondary | ICD-10-CM | POA: Diagnosis not present

## 2018-07-15 DIAGNOSIS — E7849 Other hyperlipidemia: Secondary | ICD-10-CM | POA: Diagnosis not present

## 2018-07-15 DIAGNOSIS — M545 Low back pain: Secondary | ICD-10-CM | POA: Diagnosis not present

## 2018-07-15 DIAGNOSIS — M25569 Pain in unspecified knee: Secondary | ICD-10-CM | POA: Diagnosis not present

## 2018-07-16 DIAGNOSIS — M545 Low back pain: Secondary | ICD-10-CM | POA: Diagnosis not present

## 2018-07-16 DIAGNOSIS — K529 Noninfective gastroenteritis and colitis, unspecified: Secondary | ICD-10-CM | POA: Diagnosis not present

## 2018-07-16 DIAGNOSIS — F329 Major depressive disorder, single episode, unspecified: Secondary | ICD-10-CM | POA: Diagnosis not present

## 2018-07-16 DIAGNOSIS — M1990 Unspecified osteoarthritis, unspecified site: Secondary | ICD-10-CM | POA: Diagnosis not present

## 2018-07-16 DIAGNOSIS — F0280 Dementia in other diseases classified elsewhere without behavioral disturbance: Secondary | ICD-10-CM | POA: Diagnosis not present

## 2018-07-16 DIAGNOSIS — E039 Hypothyroidism, unspecified: Secondary | ICD-10-CM | POA: Diagnosis not present

## 2018-07-18 DIAGNOSIS — M65862 Other synovitis and tenosynovitis, left lower leg: Secondary | ICD-10-CM | POA: Diagnosis not present

## 2018-07-18 DIAGNOSIS — E039 Hypothyroidism, unspecified: Secondary | ICD-10-CM | POA: Diagnosis not present

## 2018-07-18 DIAGNOSIS — E7849 Other hyperlipidemia: Secondary | ICD-10-CM | POA: Diagnosis not present

## 2018-07-18 DIAGNOSIS — M545 Low back pain: Secondary | ICD-10-CM | POA: Diagnosis not present

## 2018-07-21 DIAGNOSIS — M545 Low back pain: Secondary | ICD-10-CM | POA: Diagnosis not present

## 2018-07-21 DIAGNOSIS — K529 Noninfective gastroenteritis and colitis, unspecified: Secondary | ICD-10-CM | POA: Diagnosis not present

## 2018-07-21 DIAGNOSIS — Z79899 Other long term (current) drug therapy: Secondary | ICD-10-CM | POA: Diagnosis not present

## 2018-07-21 DIAGNOSIS — F0280 Dementia in other diseases classified elsewhere without behavioral disturbance: Secondary | ICD-10-CM | POA: Diagnosis not present

## 2018-07-21 DIAGNOSIS — F329 Major depressive disorder, single episode, unspecified: Secondary | ICD-10-CM | POA: Diagnosis not present

## 2018-07-21 DIAGNOSIS — M1990 Unspecified osteoarthritis, unspecified site: Secondary | ICD-10-CM | POA: Diagnosis not present

## 2018-07-21 DIAGNOSIS — E039 Hypothyroidism, unspecified: Secondary | ICD-10-CM | POA: Diagnosis not present

## 2018-07-22 DIAGNOSIS — E7439 Other disorders of intestinal carbohydrate absorption: Secondary | ICD-10-CM | POA: Diagnosis not present

## 2018-07-22 DIAGNOSIS — R2681 Unsteadiness on feet: Secondary | ICD-10-CM | POA: Diagnosis not present

## 2018-07-22 DIAGNOSIS — R634 Abnormal weight loss: Secondary | ICD-10-CM | POA: Diagnosis not present

## 2018-07-22 DIAGNOSIS — D72829 Elevated white blood cell count, unspecified: Secondary | ICD-10-CM | POA: Diagnosis not present

## 2018-07-23 DIAGNOSIS — F0280 Dementia in other diseases classified elsewhere without behavioral disturbance: Secondary | ICD-10-CM | POA: Diagnosis not present

## 2018-07-23 DIAGNOSIS — M545 Low back pain: Secondary | ICD-10-CM | POA: Diagnosis not present

## 2018-07-23 DIAGNOSIS — M1990 Unspecified osteoarthritis, unspecified site: Secondary | ICD-10-CM | POA: Diagnosis not present

## 2018-07-23 DIAGNOSIS — K529 Noninfective gastroenteritis and colitis, unspecified: Secondary | ICD-10-CM | POA: Diagnosis not present

## 2018-07-23 DIAGNOSIS — E039 Hypothyroidism, unspecified: Secondary | ICD-10-CM | POA: Diagnosis not present

## 2018-07-23 DIAGNOSIS — F329 Major depressive disorder, single episode, unspecified: Secondary | ICD-10-CM | POA: Diagnosis not present

## 2018-07-24 ENCOUNTER — Emergency Department (HOSPITAL_COMMUNITY)
Admission: EM | Admit: 2018-07-24 | Discharge: 2018-07-24 | Disposition: A | Discharge status: Home / Self Care | Payer: Medicare Other | Attending: Emergency Medicine | Admitting: Emergency Medicine

## 2018-07-24 ENCOUNTER — Emergency Department (HOSPITAL_COMMUNITY): Payer: Medicare Other

## 2018-07-24 DIAGNOSIS — Y92128 Other place in nursing home as the place of occurrence of the external cause: Secondary | ICD-10-CM | POA: Diagnosis not present

## 2018-07-24 DIAGNOSIS — M25552 Pain in left hip: Secondary | ICD-10-CM | POA: Diagnosis not present

## 2018-07-24 DIAGNOSIS — Y939 Activity, unspecified: Secondary | ICD-10-CM | POA: Diagnosis not present

## 2018-07-24 DIAGNOSIS — W19XXXA Unspecified fall, initial encounter: Secondary | ICD-10-CM | POA: Diagnosis not present

## 2018-07-24 DIAGNOSIS — M25562 Pain in left knee: Secondary | ICD-10-CM | POA: Diagnosis not present

## 2018-07-24 DIAGNOSIS — S80919A Unspecified superficial injury of unspecified knee, initial encounter: Secondary | ICD-10-CM | POA: Diagnosis not present

## 2018-07-24 DIAGNOSIS — Y998 Other external cause status: Secondary | ICD-10-CM | POA: Insufficient documentation

## 2018-07-24 DIAGNOSIS — R52 Pain, unspecified: Secondary | ICD-10-CM | POA: Diagnosis not present

## 2018-07-24 DIAGNOSIS — S0990XA Unspecified injury of head, initial encounter: Secondary | ICD-10-CM | POA: Insufficient documentation

## 2018-07-24 DIAGNOSIS — M25561 Pain in right knee: Secondary | ICD-10-CM | POA: Insufficient documentation

## 2018-07-24 DIAGNOSIS — E039 Hypothyroidism, unspecified: Secondary | ICD-10-CM | POA: Diagnosis not present

## 2018-07-24 DIAGNOSIS — M1990 Unspecified osteoarthritis, unspecified site: Secondary | ICD-10-CM | POA: Diagnosis not present
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
## 2018-07-24 NOTE — ED Notes (Signed)
Pt resting in bed comfortably. Awaiting ride home with daughter. No acute distress

## 2018-07-24 NOTE — ED Notes (Addendum)
Pt wheeled out to family and family given and verbalized understanding of  discharge instructions and need to follow up with pcp. Told to return if s/s worsen. No further distress or questions at this time

## 2018-07-24 NOTE — ED Triage Notes (Signed)
Pt BIBA from [facility] d/t unwitnessed fall.  Pt fell in lobby area, found laying on floor.  EMS arrived to find pt sitting in chair in her room.    Pt does not taking blood thinners.  Pt has small discoloration to back of head. C/o BL knee pain. Pt denies blurred vision, denies headache, denies n/v.

## 2018-07-24 NOTE — Discharge Instructions (Signed)
You have been seen in the Emergency Department (ED) today for a fall.  Your work up does not show any concerning injuries.  Please take over-the-counter Tylenol as needed for your pain (unless you have an allergy or your doctor as told you not to take them), or take any prescribed medication as instructed. ° °Please follow up with your doctor regarding today's Emergency Department (ED) visit and your recent fall.   ° °Return to the ED if you have any headache, confusion, slurred speech, weakness/numbness of any arm or leg, or any increased pain. ° °

## 2018-07-24 NOTE — ED Notes (Signed)
Bed: [facility] Expected date:  Expected time:  Means of arrival:  Comments: Fall

## 2018-07-24 NOTE — ED Notes (Signed)
Daughter states she was coming to get her mom and taking her back to her facility. Daughter is coming from [facility].

## 2018-07-25 DIAGNOSIS — D72829 Elevated white blood cell count, unspecified: Secondary | ICD-10-CM | POA: Diagnosis not present

## 2018-07-25 DIAGNOSIS — E7439 Other disorders of intestinal carbohydrate absorption: Secondary | ICD-10-CM | POA: Diagnosis not present

## 2018-07-25 DIAGNOSIS — R634 Abnormal weight loss: Secondary | ICD-10-CM | POA: Diagnosis not present

## 2018-07-25 DIAGNOSIS — R269 Unspecified abnormalities of gait and mobility: Secondary | ICD-10-CM | POA: Diagnosis not present

## 2018-07-27 DIAGNOSIS — K529 Noninfective gastroenteritis and colitis, unspecified: Secondary | ICD-10-CM | POA: Diagnosis not present

## 2018-07-27 DIAGNOSIS — F329 Major depressive disorder, single episode, unspecified: Secondary | ICD-10-CM | POA: Diagnosis not present

## 2018-07-27 DIAGNOSIS — M545 Low back pain: Secondary | ICD-10-CM | POA: Diagnosis not present

## 2018-07-27 DIAGNOSIS — M1990 Unspecified osteoarthritis, unspecified site: Secondary | ICD-10-CM | POA: Diagnosis not present

## 2018-07-27 DIAGNOSIS — E039 Hypothyroidism, unspecified: Secondary | ICD-10-CM | POA: Diagnosis not present

## 2018-07-27 DIAGNOSIS — F0280 Dementia in other diseases classified elsewhere without behavioral disturbance: Secondary | ICD-10-CM | POA: Diagnosis not present

## 2018-07-28 DIAGNOSIS — D72829 Elevated white blood cell count, unspecified: Secondary | ICD-10-CM | POA: Diagnosis not present

## 2018-07-29 DIAGNOSIS — E039 Hypothyroidism, unspecified: Secondary | ICD-10-CM | POA: Diagnosis not present

## 2018-07-29 DIAGNOSIS — F0280 Dementia in other diseases classified elsewhere without behavioral disturbance: Secondary | ICD-10-CM | POA: Diagnosis not present

## 2018-07-29 DIAGNOSIS — K529 Noninfective gastroenteritis and colitis, unspecified: Secondary | ICD-10-CM | POA: Diagnosis not present

## 2018-07-29 DIAGNOSIS — F329 Major depressive disorder, single episode, unspecified: Secondary | ICD-10-CM | POA: Diagnosis not present

## 2018-07-29 DIAGNOSIS — M1990 Unspecified osteoarthritis, unspecified site: Secondary | ICD-10-CM | POA: Diagnosis not present

## 2018-07-29 DIAGNOSIS — M545 Low back pain: Secondary | ICD-10-CM | POA: Diagnosis not present

## 2018-07-31 ENCOUNTER — Telehealth: Payer: Self-pay | Admitting: Neurology

## 2018-07-31 NOTE — Telephone Encounter (Signed)
Pt's daughter Leda Gauze c/a appt on 4/8. Pt is in assisted living and unable to leave due to precautions to covid-19  [facility]

## 2018-08-03 DIAGNOSIS — F329 Major depressive disorder, single episode, unspecified: Secondary | ICD-10-CM | POA: Diagnosis not present

## 2018-08-03 DIAGNOSIS — E039 Hypothyroidism, unspecified: Secondary | ICD-10-CM | POA: Diagnosis not present

## 2018-08-03 DIAGNOSIS — F0280 Dementia in other diseases classified elsewhere without behavioral disturbance: Secondary | ICD-10-CM | POA: Diagnosis not present

## 2018-08-03 DIAGNOSIS — M1990 Unspecified osteoarthritis, unspecified site: Secondary | ICD-10-CM | POA: Diagnosis not present

## 2018-08-03 DIAGNOSIS — K529 Noninfective gastroenteritis and colitis, unspecified: Secondary | ICD-10-CM | POA: Diagnosis not present

## 2018-08-03 DIAGNOSIS — M545 Low back pain: Secondary | ICD-10-CM | POA: Diagnosis not present

## 2018-08-03 NOTE — Telephone Encounter (Signed)
Noted  

## 2018-08-05 ENCOUNTER — Ambulatory Visit: Payer: Medicare Other | Admitting: Neurology

## 2018-08-06 DIAGNOSIS — K529 Noninfective gastroenteritis and colitis, unspecified: Secondary | ICD-10-CM | POA: Diagnosis not present

## 2018-08-06 DIAGNOSIS — Z9181 History of falling: Secondary | ICD-10-CM | POA: Diagnosis not present

## 2018-08-06 DIAGNOSIS — F329 Major depressive disorder, single episode, unspecified: Secondary | ICD-10-CM | POA: Diagnosis not present

## 2018-08-06 DIAGNOSIS — M545 Low back pain: Secondary | ICD-10-CM | POA: Diagnosis not present

## 2018-08-06 DIAGNOSIS — F0280 Dementia in other diseases classified elsewhere without behavioral disturbance: Secondary | ICD-10-CM | POA: Diagnosis not present

## 2018-08-06 DIAGNOSIS — M1990 Unspecified osteoarthritis, unspecified site: Secondary | ICD-10-CM | POA: Diagnosis not present

## 2018-08-06 DIAGNOSIS — E039 Hypothyroidism, unspecified: Secondary | ICD-10-CM | POA: Diagnosis not present

## 2018-08-07 DIAGNOSIS — M1990 Unspecified osteoarthritis, unspecified site: Secondary | ICD-10-CM | POA: Diagnosis not present

## 2018-08-07 DIAGNOSIS — K529 Noninfective gastroenteritis and colitis, unspecified: Secondary | ICD-10-CM | POA: Diagnosis not present

## 2018-08-07 DIAGNOSIS — F329 Major depressive disorder, single episode, unspecified: Secondary | ICD-10-CM | POA: Diagnosis not present

## 2018-08-07 DIAGNOSIS — M545 Low back pain: Secondary | ICD-10-CM | POA: Diagnosis not present

## 2018-08-07 DIAGNOSIS — F0280 Dementia in other diseases classified elsewhere without behavioral disturbance: Secondary | ICD-10-CM | POA: Diagnosis not present

## 2018-08-07 DIAGNOSIS — E039 Hypothyroidism, unspecified: Secondary | ICD-10-CM | POA: Diagnosis not present

## 2018-08-10 DIAGNOSIS — M1990 Unspecified osteoarthritis, unspecified site: Secondary | ICD-10-CM | POA: Diagnosis not present

## 2018-08-10 DIAGNOSIS — K529 Noninfective gastroenteritis and colitis, unspecified: Secondary | ICD-10-CM | POA: Diagnosis not present

## 2018-08-10 DIAGNOSIS — E039 Hypothyroidism, unspecified: Secondary | ICD-10-CM | POA: Diagnosis not present

## 2018-08-10 DIAGNOSIS — M545 Low back pain: Secondary | ICD-10-CM | POA: Diagnosis not present

## 2018-08-10 DIAGNOSIS — F329 Major depressive disorder, single episode, unspecified: Secondary | ICD-10-CM | POA: Diagnosis not present

## 2018-08-10 DIAGNOSIS — F0280 Dementia in other diseases classified elsewhere without behavioral disturbance: Secondary | ICD-10-CM | POA: Diagnosis not present

## 2018-08-12 DIAGNOSIS — N179 Acute kidney failure, unspecified: Secondary | ICD-10-CM | POA: Diagnosis not present

## 2018-08-12 DIAGNOSIS — R2681 Unsteadiness on feet: Secondary | ICD-10-CM | POA: Diagnosis not present

## 2018-08-12 DIAGNOSIS — R790 Abnormal level of blood mineral: Secondary | ICD-10-CM | POA: Diagnosis not present

## 2018-08-12 DIAGNOSIS — E7849 Other hyperlipidemia: Secondary | ICD-10-CM | POA: Diagnosis not present

## 2018-08-13 DIAGNOSIS — M1990 Unspecified osteoarthritis, unspecified site: Secondary | ICD-10-CM | POA: Diagnosis not present

## 2018-08-13 DIAGNOSIS — E039 Hypothyroidism, unspecified: Secondary | ICD-10-CM | POA: Diagnosis not present

## 2018-08-13 DIAGNOSIS — F329 Major depressive disorder, single episode, unspecified: Secondary | ICD-10-CM | POA: Diagnosis not present

## 2018-08-13 DIAGNOSIS — K529 Noninfective gastroenteritis and colitis, unspecified: Secondary | ICD-10-CM | POA: Diagnosis not present

## 2018-08-13 DIAGNOSIS — M545 Low back pain: Secondary | ICD-10-CM | POA: Diagnosis not present

## 2018-08-13 DIAGNOSIS — F0280 Dementia in other diseases classified elsewhere without behavioral disturbance: Secondary | ICD-10-CM | POA: Diagnosis not present

## 2018-08-17 DIAGNOSIS — E039 Hypothyroidism, unspecified: Secondary | ICD-10-CM | POA: Diagnosis not present

## 2018-08-17 DIAGNOSIS — K529 Noninfective gastroenteritis and colitis, unspecified: Secondary | ICD-10-CM | POA: Diagnosis not present

## 2018-08-17 DIAGNOSIS — F0280 Dementia in other diseases classified elsewhere without behavioral disturbance: Secondary | ICD-10-CM | POA: Diagnosis not present

## 2018-08-17 DIAGNOSIS — M1990 Unspecified osteoarthritis, unspecified site: Secondary | ICD-10-CM | POA: Diagnosis not present

## 2018-08-17 DIAGNOSIS — M545 Low back pain: Secondary | ICD-10-CM | POA: Diagnosis not present

## 2018-08-17 DIAGNOSIS — F329 Major depressive disorder, single episode, unspecified: Secondary | ICD-10-CM | POA: Diagnosis not present

## 2018-08-20 DIAGNOSIS — M1990 Unspecified osteoarthritis, unspecified site: Secondary | ICD-10-CM | POA: Diagnosis not present

## 2018-08-20 DIAGNOSIS — K529 Noninfective gastroenteritis and colitis, unspecified: Secondary | ICD-10-CM | POA: Diagnosis not present

## 2018-08-20 DIAGNOSIS — M545 Low back pain: Secondary | ICD-10-CM | POA: Diagnosis not present

## 2018-08-20 DIAGNOSIS — F329 Major depressive disorder, single episode, unspecified: Secondary | ICD-10-CM | POA: Diagnosis not present

## 2018-08-20 DIAGNOSIS — F0280 Dementia in other diseases classified elsewhere without behavioral disturbance: Secondary | ICD-10-CM | POA: Diagnosis not present

## 2018-08-20 DIAGNOSIS — E039 Hypothyroidism, unspecified: Secondary | ICD-10-CM | POA: Diagnosis not present

## 2018-08-24 DIAGNOSIS — M1990 Unspecified osteoarthritis, unspecified site: Secondary | ICD-10-CM | POA: Diagnosis not present

## 2018-08-24 DIAGNOSIS — M545 Low back pain: Secondary | ICD-10-CM | POA: Diagnosis not present

## 2018-08-24 DIAGNOSIS — K529 Noninfective gastroenteritis and colitis, unspecified: Secondary | ICD-10-CM | POA: Diagnosis not present

## 2018-08-24 DIAGNOSIS — F329 Major depressive disorder, single episode, unspecified: Secondary | ICD-10-CM | POA: Diagnosis not present

## 2018-08-24 DIAGNOSIS — E039 Hypothyroidism, unspecified: Secondary | ICD-10-CM | POA: Diagnosis not present

## 2018-08-24 DIAGNOSIS — F0280 Dementia in other diseases classified elsewhere without behavioral disturbance: Secondary | ICD-10-CM | POA: Diagnosis not present

## 2018-08-26 DIAGNOSIS — K529 Noninfective gastroenteritis and colitis, unspecified: Secondary | ICD-10-CM | POA: Diagnosis not present

## 2018-08-26 DIAGNOSIS — R600 Localized edema: Secondary | ICD-10-CM | POA: Diagnosis not present

## 2018-08-26 DIAGNOSIS — E039 Hypothyroidism, unspecified: Secondary | ICD-10-CM | POA: Diagnosis not present

## 2018-08-26 DIAGNOSIS — F0390 Unspecified dementia without behavioral disturbance: Secondary | ICD-10-CM | POA: Diagnosis not present

## 2018-08-26 DIAGNOSIS — F329 Major depressive disorder, single episode, unspecified: Secondary | ICD-10-CM | POA: Diagnosis not present

## 2018-08-26 DIAGNOSIS — F0280 Dementia in other diseases classified elsewhere without behavioral disturbance: Secondary | ICD-10-CM | POA: Diagnosis not present

## 2018-08-26 DIAGNOSIS — M545 Low back pain: Secondary | ICD-10-CM | POA: Diagnosis not present

## 2018-08-26 DIAGNOSIS — F419 Anxiety disorder, unspecified: Secondary | ICD-10-CM | POA: Diagnosis not present

## 2018-08-26 DIAGNOSIS — M1990 Unspecified osteoarthritis, unspecified site: Secondary | ICD-10-CM | POA: Diagnosis not present

## 2018-08-27 DIAGNOSIS — R269 Unspecified abnormalities of gait and mobility: Secondary | ICD-10-CM | POA: Diagnosis not present

## 2018-08-27 DIAGNOSIS — F0390 Unspecified dementia without behavioral disturbance: Secondary | ICD-10-CM | POA: Diagnosis not present

## 2018-08-27 DIAGNOSIS — F419 Anxiety disorder, unspecified: Secondary | ICD-10-CM | POA: Diagnosis not present

## 2018-08-27 DIAGNOSIS — E7849 Other hyperlipidemia: Secondary | ICD-10-CM | POA: Diagnosis not present

## 2018-08-31 DIAGNOSIS — M1990 Unspecified osteoarthritis, unspecified site: Secondary | ICD-10-CM | POA: Diagnosis not present

## 2018-08-31 DIAGNOSIS — F329 Major depressive disorder, single episode, unspecified: Secondary | ICD-10-CM | POA: Diagnosis not present

## 2018-08-31 DIAGNOSIS — F0280 Dementia in other diseases classified elsewhere without behavioral disturbance: Secondary | ICD-10-CM | POA: Diagnosis not present

## 2018-08-31 DIAGNOSIS — E039 Hypothyroidism, unspecified: Secondary | ICD-10-CM | POA: Diagnosis not present

## 2018-08-31 DIAGNOSIS — M545 Low back pain: Secondary | ICD-10-CM | POA: Diagnosis not present

## 2018-08-31 DIAGNOSIS — K529 Noninfective gastroenteritis and colitis, unspecified: Secondary | ICD-10-CM | POA: Diagnosis not present

## 2018-09-04 DIAGNOSIS — K529 Noninfective gastroenteritis and colitis, unspecified: Secondary | ICD-10-CM | POA: Diagnosis not present

## 2018-09-04 DIAGNOSIS — R1319 Other dysphagia: Secondary | ICD-10-CM | POA: Diagnosis not present

## 2018-09-04 DIAGNOSIS — M1990 Unspecified osteoarthritis, unspecified site: Secondary | ICD-10-CM | POA: Diagnosis not present

## 2018-09-04 DIAGNOSIS — M545 Low back pain: Secondary | ICD-10-CM | POA: Diagnosis not present

## 2018-09-04 DIAGNOSIS — F329 Major depressive disorder, single episode, unspecified: Secondary | ICD-10-CM | POA: Diagnosis not present

## 2018-09-04 DIAGNOSIS — F0280 Dementia in other diseases classified elsewhere without behavioral disturbance: Secondary | ICD-10-CM | POA: Diagnosis not present

## 2018-09-04 DIAGNOSIS — E039 Hypothyroidism, unspecified: Secondary | ICD-10-CM | POA: Diagnosis not present

## 2018-09-04 DIAGNOSIS — E7849 Other hyperlipidemia: Secondary | ICD-10-CM | POA: Diagnosis not present

## 2018-09-09 DIAGNOSIS — R1319 Other dysphagia: Secondary | ICD-10-CM | POA: Diagnosis not present

## 2018-09-09 DIAGNOSIS — F329 Major depressive disorder, single episode, unspecified: Secondary | ICD-10-CM | POA: Diagnosis not present

## 2018-09-09 DIAGNOSIS — E7849 Other hyperlipidemia: Secondary | ICD-10-CM | POA: Diagnosis not present

## 2018-09-09 DIAGNOSIS — M545 Low back pain: Secondary | ICD-10-CM | POA: Diagnosis not present

## 2018-09-15 DIAGNOSIS — M545 Low back pain: Secondary | ICD-10-CM | POA: Diagnosis not present

## 2018-09-15 DIAGNOSIS — E039 Hypothyroidism, unspecified: Secondary | ICD-10-CM | POA: Diagnosis not present

## 2018-09-15 DIAGNOSIS — Z9181 History of falling: Secondary | ICD-10-CM | POA: Diagnosis not present

## 2018-09-15 DIAGNOSIS — R1312 Dysphagia, oropharyngeal phase: Secondary | ICD-10-CM | POA: Diagnosis not present

## 2018-09-15 DIAGNOSIS — F0280 Dementia in other diseases classified elsewhere without behavioral disturbance: Secondary | ICD-10-CM | POA: Diagnosis not present

## 2018-09-15 DIAGNOSIS — G8929 Other chronic pain: Secondary | ICD-10-CM | POA: Diagnosis not present

## 2018-09-15 DIAGNOSIS — R296 Repeated falls: Secondary | ICD-10-CM | POA: Diagnosis not present

## 2018-09-15 DIAGNOSIS — F0390 Unspecified dementia without behavioral disturbance: Secondary | ICD-10-CM | POA: Diagnosis not present

## 2018-09-18 DIAGNOSIS — R296 Repeated falls: Secondary | ICD-10-CM | POA: Diagnosis not present

## 2018-09-18 DIAGNOSIS — G8929 Other chronic pain: Secondary | ICD-10-CM | POA: Diagnosis not present

## 2018-09-18 DIAGNOSIS — M545 Low back pain: Secondary | ICD-10-CM | POA: Diagnosis not present

## 2018-09-18 DIAGNOSIS — E039 Hypothyroidism, unspecified: Secondary | ICD-10-CM | POA: Diagnosis not present

## 2018-09-18 DIAGNOSIS — F0280 Dementia in other diseases classified elsewhere without behavioral disturbance: Secondary | ICD-10-CM | POA: Diagnosis not present

## 2018-09-18 DIAGNOSIS — R1312 Dysphagia, oropharyngeal phase: Secondary | ICD-10-CM | POA: Diagnosis not present

## 2018-09-22 DIAGNOSIS — G8929 Other chronic pain: Secondary | ICD-10-CM | POA: Diagnosis not present

## 2018-09-22 DIAGNOSIS — F482 Pseudobulbar affect: Secondary | ICD-10-CM | POA: Diagnosis not present

## 2018-09-22 DIAGNOSIS — R296 Repeated falls: Secondary | ICD-10-CM | POA: Diagnosis not present

## 2018-09-22 DIAGNOSIS — F330 Major depressive disorder, recurrent, mild: Secondary | ICD-10-CM | POA: Diagnosis not present

## 2018-09-22 DIAGNOSIS — F0280 Dementia in other diseases classified elsewhere without behavioral disturbance: Secondary | ICD-10-CM | POA: Diagnosis not present

## 2018-09-22 DIAGNOSIS — M545 Low back pain: Secondary | ICD-10-CM | POA: Diagnosis not present

## 2018-09-22 DIAGNOSIS — F419 Anxiety disorder, unspecified: Secondary | ICD-10-CM | POA: Diagnosis not present

## 2018-09-22 DIAGNOSIS — R1312 Dysphagia, oropharyngeal phase: Secondary | ICD-10-CM | POA: Diagnosis not present

## 2018-09-22 DIAGNOSIS — R4189 Other symptoms and signs involving cognitive functions and awareness: Secondary | ICD-10-CM | POA: Diagnosis not present

## 2018-09-22 DIAGNOSIS — E039 Hypothyroidism, unspecified: Secondary | ICD-10-CM | POA: Diagnosis not present

## 2018-09-23 DIAGNOSIS — F0280 Dementia in other diseases classified elsewhere without behavioral disturbance: Secondary | ICD-10-CM | POA: Diagnosis not present

## 2018-09-23 DIAGNOSIS — R197 Diarrhea, unspecified: Secondary | ICD-10-CM | POA: Diagnosis not present

## 2018-09-23 DIAGNOSIS — R634 Abnormal weight loss: Secondary | ICD-10-CM | POA: Diagnosis not present

## 2018-09-23 DIAGNOSIS — R296 Repeated falls: Secondary | ICD-10-CM | POA: Diagnosis not present

## 2018-09-23 DIAGNOSIS — R1312 Dysphagia, oropharyngeal phase: Secondary | ICD-10-CM | POA: Diagnosis not present

## 2018-09-23 DIAGNOSIS — G8929 Other chronic pain: Secondary | ICD-10-CM | POA: Diagnosis not present

## 2018-09-23 DIAGNOSIS — M545 Low back pain: Secondary | ICD-10-CM | POA: Diagnosis not present

## 2018-09-23 DIAGNOSIS — E039 Hypothyroidism, unspecified: Secondary | ICD-10-CM | POA: Diagnosis not present

## 2018-09-23 DIAGNOSIS — R2681 Unsteadiness on feet: Secondary | ICD-10-CM | POA: Diagnosis not present

## 2018-09-23 DIAGNOSIS — F329 Major depressive disorder, single episode, unspecified: Secondary | ICD-10-CM | POA: Diagnosis not present

## 2018-09-24 ENCOUNTER — Inpatient Hospital Stay (HOSPITAL_COMMUNITY)
Admission: EM | Admit: 2018-09-24 | Discharge: 2018-09-29 | DRG: 178 | Disposition: A | Discharge status: Skilled Nursing Facility | Payer: Medicare Other | Source: Skilled Nursing Facility | Attending: Family Medicine | Admitting: Family Medicine

## 2018-09-24 ENCOUNTER — Other Ambulatory Visit: Payer: Self-pay

## 2018-09-24 ENCOUNTER — Emergency Department (HOSPITAL_COMMUNITY): Payer: Medicare Other

## 2018-09-24 ENCOUNTER — Encounter (HOSPITAL_COMMUNITY): Payer: Self-pay | Admitting: Emergency Medicine

## 2018-09-24 DIAGNOSIS — Z79899 Other long term (current) drug therapy: Secondary | ICD-10-CM | POA: Diagnosis not present

## 2018-09-24 DIAGNOSIS — Z888 Allergy status to other drugs, medicaments and biological substances status: Secondary | ICD-10-CM

## 2018-09-24 DIAGNOSIS — R0902 Hypoxemia: Secondary | ICD-10-CM | POA: Diagnosis not present

## 2018-09-24 DIAGNOSIS — R278 Other lack of coordination: Secondary | ICD-10-CM | POA: Diagnosis not present

## 2018-09-24 DIAGNOSIS — R634 Abnormal weight loss: Secondary | ICD-10-CM | POA: Diagnosis not present

## 2018-09-24 DIAGNOSIS — Z79890 Hormone replacement therapy (postmenopausal): Secondary | ICD-10-CM

## 2018-09-24 DIAGNOSIS — R110 Nausea: Secondary | ICD-10-CM | POA: Diagnosis not present

## 2018-09-24 DIAGNOSIS — R2681 Unsteadiness on feet: Secondary | ICD-10-CM | POA: Diagnosis not present

## 2018-09-24 DIAGNOSIS — G309 Alzheimer's disease, unspecified: Secondary | ICD-10-CM | POA: Diagnosis present

## 2018-09-24 DIAGNOSIS — Z881 Allergy status to other antibiotic agents status: Secondary | ICD-10-CM

## 2018-09-24 DIAGNOSIS — R2689 Other abnormalities of gait and mobility: Secondary | ICD-10-CM | POA: Diagnosis not present

## 2018-09-24 DIAGNOSIS — R112 Nausea with vomiting, unspecified: Secondary | ICD-10-CM | POA: Diagnosis present

## 2018-09-24 DIAGNOSIS — Z7401 Bed confinement status: Secondary | ICD-10-CM | POA: Diagnosis not present

## 2018-09-24 DIAGNOSIS — Z882 Allergy status to sulfonamides status: Secondary | ICD-10-CM

## 2018-09-24 DIAGNOSIS — F0280 Dementia in other diseases classified elsewhere without behavioral disturbance: Secondary | ICD-10-CM | POA: Diagnosis present

## 2018-09-24 DIAGNOSIS — R197 Diarrhea, unspecified: Secondary | ICD-10-CM | POA: Diagnosis present

## 2018-09-24 DIAGNOSIS — U071 COVID-19: Secondary | ICD-10-CM | POA: Diagnosis present

## 2018-09-24 DIAGNOSIS — R41841 Cognitive communication deficit: Secondary | ICD-10-CM | POA: Diagnosis not present

## 2018-09-24 DIAGNOSIS — M2550 Pain in unspecified joint: Secondary | ICD-10-CM | POA: Diagnosis not present

## 2018-09-24 DIAGNOSIS — N179 Acute kidney failure, unspecified: Secondary | ICD-10-CM | POA: Diagnosis present

## 2018-09-24 DIAGNOSIS — E039 Hypothyroidism, unspecified: Secondary | ICD-10-CM | POA: Diagnosis present

## 2018-09-24 DIAGNOSIS — K8020 Calculus of gallbladder without cholecystitis without obstruction: Secondary | ICD-10-CM | POA: Diagnosis not present

## 2018-09-24 DIAGNOSIS — E785 Hyperlipidemia, unspecified: Secondary | ICD-10-CM | POA: Diagnosis present

## 2018-09-24 DIAGNOSIS — E860 Dehydration: Secondary | ICD-10-CM | POA: Diagnosis present

## 2018-09-24 DIAGNOSIS — F0390 Unspecified dementia without behavioral disturbance: Secondary | ICD-10-CM | POA: Diagnosis not present

## 2018-09-24 DIAGNOSIS — R269 Unspecified abnormalities of gait and mobility: Secondary | ICD-10-CM | POA: Diagnosis not present

## 2018-09-24 DIAGNOSIS — F329 Major depressive disorder, single episode, unspecified: Secondary | ICD-10-CM | POA: Diagnosis not present

## 2018-09-24 DIAGNOSIS — M6281 Muscle weakness (generalized): Secondary | ICD-10-CM | POA: Diagnosis not present

## 2018-09-24 DIAGNOSIS — R293 Abnormal posture: Secondary | ICD-10-CM | POA: Diagnosis not present

## 2018-09-24 DIAGNOSIS — N200 Calculus of kidney: Secondary | ICD-10-CM | POA: Diagnosis not present

## 2018-09-24 LAB — COMPREHENSIVE METABOLIC PANEL
ALT: 14 U/L (ref 0–44)
AST: 34 U/L (ref 15–41)
Albumin: 4.1 g/dL (ref 3.5–5.0)
Alkaline Phosphatase: 50 U/L (ref 38–126)
Anion gap: 10 (ref 5–15)
BUN: 33 mg/dL — ABNORMAL HIGH (ref 8–23)
CO2: 23 mmol/L (ref 22–32)
Calcium: 9.2 mg/dL (ref 8.9–10.3)
Chloride: 103 mmol/L (ref 98–111)
Creatinine, Ser: 1.49 mg/dL — ABNORMAL HIGH (ref 0.44–1.00)
GFR calc Af Amer: 36 mL/min — ABNORMAL LOW (ref >60)
GFR calc non Af Amer: 31 mL/min — ABNORMAL LOW (ref >60)
Glucose, Bld: 93 mg/dL (ref 70–99)
Potassium: 3.5 mmol/L (ref 3.5–5.1)
Sodium: 136 mmol/L (ref 135–145)
Total Bilirubin: 0.3 mg/dL (ref 0.3–1.2)
Total Protein: 6.9 g/dL (ref 6.5–8.1)

## 2018-09-24 LAB — CBC WITH DIFFERENTIAL/PLATELET
Abs Immature Granulocytes: 0.01 10*3/uL (ref 0.00–0.07)
Basophils Absolute: 0 10*3/uL (ref 0.0–0.1)
Basophils Relative: 1 %
Eosinophils Absolute: 0.1 10*3/uL (ref 0.0–0.5)
Eosinophils Relative: 1 %
HCT: 41.9 % (ref 36.0–46.0)
Hemoglobin: 13.8 g/dL (ref 12.0–15.0)
Immature Granulocytes: 0 %
Lymphocytes Relative: 18 %
Lymphs Abs: 1.1 10*3/uL (ref 0.7–4.0)
MCH: 32.8 pg (ref 26.0–34.0)
MCHC: 32.9 g/dL (ref 30.0–36.0)
MCV: 99.5 fL (ref 80.0–100.0)
Monocytes Absolute: 0.5 10*3/uL (ref 0.1–1.0)
Monocytes Relative: 9 %
Neutro Abs: 4.4 10*3/uL (ref 1.7–7.7)
Neutrophils Relative %: 71 %
Platelets: 248 10*3/uL (ref 150–400)
RBC: 4.21 MIL/uL (ref 3.87–5.11)
RDW: 12.8 % (ref 11.5–15.5)
WBC: 6.2 10*3/uL (ref 4.0–10.5)
nRBC: 0 % (ref 0.0–0.2)

## 2018-09-24 LAB — URINALYSIS, ROUTINE W REFLEX MICROSCOPIC
Bilirubin Urine: NEGATIVE
Glucose, UA: NEGATIVE mg/dL
Hgb urine dipstick: NEGATIVE
Ketones, ur: 5 mg/dL — AB
Leukocytes,Ua: NEGATIVE
Nitrite: NEGATIVE
Protein, ur: NEGATIVE mg/dL
Specific Gravity, Urine: 1.023 (ref 1.005–1.030)
pH: 5 (ref 5.0–8.0)

## 2018-09-24 LAB — SARS CORONAVIRUS 2 BY RT PCR (HOSPITAL ORDER, PERFORMED IN ~~LOC~~ HOSPITAL LAB): SARS Coronavirus 2: POSITIVE — AB

## 2018-09-24 LAB — LIPASE, BLOOD: Lipase: 46 U/L (ref 11–51)

## 2018-09-24 MED ORDER — SODIUM CHLORIDE 0.9 % IV SOLN
INTRAVENOUS | Status: DC
  Administered 2018-09-25: 02:00:00 via INTRAVENOUS

## 2018-09-24 MED ORDER — ACETAMINOPHEN 650 MG RE SUPP: 650.0000 mg | Freq: Four times a day (QID) | RECTAL | Status: DC | PRN

## 2018-09-24 MED ORDER — ENOXAPARIN SODIUM 30 MG/0.3ML ~~LOC~~ SOLN
30.0000 mg | SUBCUTANEOUS | Status: DC
  Administered 2018-09-25 – 2018-09-26 (×2): 30 mg via SUBCUTANEOUS
  Filled 2018-09-24 (×2): qty 0.3

## 2018-09-24 MED ORDER — ACETAMINOPHEN 325 MG PO TABS
650.0000 mg | ORAL_TABLET | Freq: Four times a day (QID) | ORAL | Status: DC | PRN
  Administered 2018-09-25 – 2018-09-28 (×4): 650 mg via ORAL
  Filled 2018-09-24 (×4): qty 2

## 2018-09-24 MED ORDER — ENOXAPARIN SODIUM 30 MG/0.3ML ~~LOC~~ SOLN
30.0000 mg | SUBCUTANEOUS | Status: DC
  Administered 2018-09-24: 30 mg via SUBCUTANEOUS
  Filled 2018-09-24: qty 0.3

## 2018-09-24 MED ORDER — ONDANSETRON HCL 4 MG/2ML IJ SOLN
4.0000 mg | Freq: Once | INTRAMUSCULAR | Status: AC
  Administered 2018-09-24: 4 mg via INTRAVENOUS
  Filled 2018-09-24: qty 2

## 2018-09-24 MED ORDER — ONDANSETRON HCL 4 MG/2ML IJ SOLN: 4.0000 mg | Freq: Four times a day (QID) | INTRAMUSCULAR | Status: DC | PRN

## 2018-09-24 MED ORDER — SODIUM CHLORIDE 0.9 % IV BOLUS
1000.0000 mL | Freq: Once | INTRAVENOUS | Status: AC
  Administered 2018-09-24: 1000 mL via INTRAVENOUS
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
## 2018-09-24 NOTE — ED Notes (Signed)
Pt found attempting to get OOB pulled ekg leads, BP cuff, and iv site right AC off. New IV site started left wrist #20 angio-cath and pt returned to monitor. NSR on monitor VSS pt alert to person and place but not circumstances.

## 2018-09-24 NOTE — ED Triage Notes (Signed)
Patient arrived from [facility] by EMS. Pt c/o N/V. N/V started today.   EMS report no fever.   EMS VS BP 112/78, HR 72, SpO2 97%.  Hx of Alzheimer.
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
## 2018-09-25 DIAGNOSIS — R197 Diarrhea, unspecified: Secondary | ICD-10-CM

## 2018-09-25 LAB — LACTATE DEHYDROGENASE: LDH: 136 U/L (ref 98–192)

## 2018-09-25 LAB — C-REACTIVE PROTEIN: CRP: <0.8 mg/dL (ref <1.0)

## 2018-09-25 LAB — BASIC METABOLIC PANEL
Anion gap: 7 (ref 5–15)
BUN: 22 mg/dL (ref 8–23)
CO2: 24 mmol/L (ref 22–32)
Calcium: 8.6 mg/dL — ABNORMAL LOW (ref 8.9–10.3)
Chloride: 107 mmol/L (ref 98–111)
Creatinine, Ser: 0.94 mg/dL (ref 0.44–1.00)
GFR calc Af Amer: >60 mL/min (ref >60)
GFR calc non Af Amer: 54 mL/min — ABNORMAL LOW (ref >60)
Glucose, Bld: 88 mg/dL (ref 70–99)
Potassium: 3.5 mmol/L (ref 3.5–5.1)
Sodium: 138 mmol/L (ref 135–145)

## 2018-09-25 LAB — CK TOTAL AND CKMB (NOT AT ARMC)
CK, MB: 2.8 ng/mL (ref 0.5–5.0)
Relative Index: 2.1 (ref 0.0–2.5)
Total CK: 134 U/L (ref 38–234)

## 2018-09-25 LAB — ABO/RH: ABO/RH(D): A POS

## 2018-09-25 LAB — FERRITIN: Ferritin: 130 ng/mL (ref 11–307)

## 2018-09-25 LAB — TROPONIN I: Troponin I: <0.03 ng/mL (ref <0.03)

## 2018-09-25 LAB — URINE CULTURE: Culture: NO GROWTH

## 2018-09-25 LAB — PROCALCITONIN: Procalcitonin: <0.1 ng/mL

## 2018-09-25 LAB — SEDIMENTATION RATE: Sed Rate: 9 mm/hr (ref 0–22)

## 2018-09-25 LAB — FIBRINOGEN: Fibrinogen: 326 mg/dL (ref 210–475)

## 2018-09-25 LAB — BRAIN NATRIURETIC PEPTIDE: B Natriuretic Peptide: 32.7 pg/mL (ref 0.0–100.0)

## 2018-09-25 LAB — D-DIMER, QUANTITATIVE: D-Dimer, Quant: 0.47 ug/mL-FEU (ref 0.00–0.50)

## 2018-09-25 LAB — MRSA PCR SCREENING: MRSA by PCR: NEGATIVE

## 2018-09-25 MED ORDER — PRAVASTATIN SODIUM 40 MG PO TABS
40.0000 mg | ORAL_TABLET | Freq: Every day | ORAL | Status: DC
  Administered 2018-09-25 – 2018-09-29 (×5): 40 mg via ORAL
  Filled 2018-09-25 (×6): qty 1

## 2018-09-25 MED ORDER — DONEPEZIL HCL 5 MG PO TABS
5.0000 mg | ORAL_TABLET | Freq: Every day | ORAL | Status: DC
  Administered 2018-09-26 – 2018-09-28 (×4): 5 mg via ORAL
  Filled 2018-09-25 (×4): qty 0.5
  Filled 2018-09-25 (×2): qty 1

## 2018-09-25 MED ORDER — FLUOXETINE HCL 20 MG PO CAPS
20.0000 mg | ORAL_CAPSULE | Freq: Every day | ORAL | Status: DC
  Administered 2018-09-25 – 2018-09-29 (×5): 20 mg via ORAL
  Filled 2018-09-25 (×6): qty 1

## 2018-09-25 MED ORDER — LEVOTHYROXINE SODIUM 75 MCG PO TABS
75.0000 ug | ORAL_TABLET | Freq: Every day | ORAL | Status: DC
  Administered 2018-09-25 – 2018-09-29 (×5): 75 ug via ORAL
  Filled 2018-09-25 (×5): qty 1

## 2018-09-25 MED ORDER — BUSPIRONE HCL 5 MG PO TABS
5.0000 mg | ORAL_TABLET | Freq: Two times a day (BID) | ORAL | Status: DC
  Administered 2018-09-25 – 2018-09-29 (×9): 5 mg via ORAL
  Filled 2018-09-25 (×11): qty 1

## 2018-09-25 NOTE — NC FL2 (Signed)
[place] LEVEL OF CARE SCREENING TOOL     IDENTIFICATION  Patient Name: Monica [name] Birthdate: [birth year] Sex: female Admission Date (Current Location): 09/24/2018  [facility] and [place] Number:  Herbalist and Address:  [facility] [place] [address], [place], [place] [address]([place])      Provider Number: [id number]  Attending Physician Name and Address:  [name], MD  Relative Name and Phone Number:  Leda Gauze [phone]    Current Level of Care: Hospital Recommended Level of Care: [place] Prior Approval Number:    Date Approved/Denied:   PASRR Number: [id number]  Discharge Plan: (ALF)    Current Diagnoses: Patient Active Problem List   Diagnosis Date Noted  . Nausea & vomiting 09/24/2018  . ARF (acute renal failure) ([place]) 09/24/2018  . Dementia ([place]) 09/24/2018  . COVID-19 virus infection 09/24/2018  . Traumatic rhabdomyolysis ([place]) 05/27/2017  . Acute lower UTI 05/27/2017  . Diarrhea 05/27/2017  . Orthostatic hypotension 05/27/2017  . Fall 05/26/2017  . Gait disorder 11/29/2016  . Memory deficits 05/20/2013    Orientation RESPIRATION BLADDER Height & Weight     Self, Place  Normal Incontinent, External catheter Weight: 149 lb 14.6 oz (68 kg) Height:  5\' 4"  (162.6 cm)  BEHAVIORAL SYMPTOMS/MOOD NEUROLOGICAL BOWEL NUTRITION STATUS      Incontinent Diet(see discharge summary)  AMBULATORY STATUS COMMUNICATION OF NEEDS Skin   Extensive Assist Verbally Other (Comment)(MASD on buttocks, vagina and perineum)                       Personal Care Assistance Level of Assistance  Bathing, Feeding, Dressing, Total care Bathing Assistance: Limited assistance Feeding assistance: Limited assistance Dressing Assistance: Limited assistance Total Care Assistance: Maximum assistance   Functional Limitations Info  Sight, Hearing, Speech Sight Info: Adequate Hearing Info:  Adequate Speech Info: Adequate    SPECIAL CARE FACTORS FREQUENCY                       Contractures Contractures Info: Not present    Additional Factors Info  Code Status, Allergies, Isolation Precautions Code Status Info: full Allergies Info: Butylaminobenzoyldiethylaminoethyl, Ciprofloxacin, Erythromycin, Hydralazine, Namenda (memantine Hcl), Prednisone, Sulfa Antibiotics     Isolation Precautions Info: Air and contact precautions, emerging pathogen     Current Medications (09/25/2018):  This is the current hospital active medication list Current Facility-Administered Medications  Medication Dose Route Frequency Provider Last Rate Last Dose  . acetaminophen (TYLENOL) tablet 650 mg  650 mg Oral Q6H PRN [name], MD   650 mg at 09/25/18 5852   Or  . acetaminophen (TYLENOL) suppository 650 mg  650 mg Rectal Q6H PRN [name], MD      . busPIRone (BUSPAR) tablet 5 mg  5 mg Oral BID [name], MD   5 mg at 09/25/18 0900  . donepezil (ARICEPT) tablet 5 mg  5 mg Oral QHS [name], MD      . enoxaparin (LOVENOX) injection 30 mg  30 mg Subcutaneous Q24H [name], MD      . FLUoxetine (PROZAC) capsule 20 mg  20 mg Oral Daily [name], MD   20 mg at 09/25/18 0900  . levothyroxine (SYNTHROID) tablet 75 mcg  75 mcg Oral QAC breakfast [name], MD   75 mcg at 09/25/18 0538  . ondansetron (ZOFRAN) injection 4 mg  4 mg Intravenous Q6H PRN [name], MD      .  pravastatin (PRAVACHOL) tablet 40 mg  40 mg Oral Daily [name], MD   40 mg at 09/25/18 0900     Discharge Medications: Please see discharge summary for a list of discharge medications.  Relevant Imaging Results:  Relevant Lab Results:   Additional Information SSN: [id number]  Alberteen Sam, LCSW

## 2018-09-25 NOTE — Progress Notes (Signed)
[facility] PROGRESS NOTE  Monica [name]  LEX:[id number] DOB: 01/04/1928 DOA: 09/24/2018 PCP: [name], MD Brief Narrative: Monica [name] is an 85 y.o. female with a history of Alzheimer's dementia, HLD, hypothyroidism who presented from [facility] on her birthday with nausea and vomiting, possibly also diarrhea found to have AKI and covid-positive with no pulmonary symptoms. She was admitted to [facility], put on IV fluids with improvement in creatinine back to normal. She has been tolerating per oral intake without vomiting albeit with poor per oral intake.   Subjective: Patient is confused, but denies any nausea, pain, dyspnea. Had small loose stool this morning per RN. Has tolerated po.   Objective: BP (!) 97/50 (BP Location: Left Arm)   Pulse 82   Temp 98.3 F (36.8 C) (Axillary)   Resp (!) 21   Ht 5\' 4"  (1.626 m)   Wt 68 kg   SpO2 98%   BMI 25.73 kg/m   Gen: Elderly female in no distress Pulm: Clear and nonlabored on room air  CV: RRR, no murmur, no JVD, no edema GI: Soft, NT, ND, +BS  Neuro: Alert, disoriented. No focal deficits. Ext: Warm, no deformities Skin: No rashes, lesions or ulcers  Assessment & Plan: AKI: Creatinine back to baseline with overnight IV fluids. Suspect prerenal etiology due to poor per oral intake possibly due to covid infection.  - Tolerating po, can DC IV fluids  Nausea, vomiting: Currently tolerating po.  Covid-19 infection: Likely cause of mild GI symptoms, primarily poor appetite. No pulmonary symptoms or inflammatory marker elevations.  - Continue airborne, contact precautions. PPE including surgical gown, gloves, face shield, cap, shoe covers, and N-95 used during this encounter in a negative pressure room.  - Avoid NSAIDs.  Alzheimer's dementia:  - Continue home medications - Delirium precautions.   Hypothyroidism:  - No change to synthroid  Hyperlipidemia:  - Continuing statin  Patient is stable for discharge to  [facility]. Per CSW, they are currently requiring 3 negative tests to take the patient back.  [name], MD [facility] [email or web address] Password [facility] 09/25/2018, 4:36 PM

## 2018-09-25 NOTE — Progress Notes (Signed)
[facility] is uncertain if patient can return at this time, they are following up and will notify CSW with answer. They first reported no that they need 3 negative tests. CSW reiterated to them that patient is experiencing no COVID symptoms at this time and is medically stable to return.   [place], [place]

## 2018-09-25 NOTE — TOC Initial Note (Signed)
Transition of Care [facility]) - Initial/Assessment Note    Patient Details  Name: Monica [name] MRN: [id number] Date of Birth: 11/04/1927  [facility] of [facility]) CM/SW Contact:    Alberteen Sam, [place] Phone Number: [phone] 09/25/2018, 2:40 PM  Clinical Narrative:                  CSW consulted with patient's daughter Leda Gauze regarding discharge planning. Leda Gauze is glad to her her mother is doing well and would like patient to return to [facility] as soon as possible. She reports she gets confused when transitioned to a new place, and will be more comfortable at home at [facility]. CSW informed Leda Gauze that CSW is working with Director Nathaniel Man on barriers for patient to return to [facility] as they are refusing to accept at this time. CSW will continue to keep Leda Gauze updated on progress with placement back to [facility]. Leda Gauze appreciative of call and reports her mother must go back to [place] [facility] as she does not want her going to a different facility. CSW, Nathaniel Man and DSS are working with Nanine Means on solutions, will continue to follow.   Expected Discharge Plan: Assisted Living Barriers to Discharge: Other (comment)(ALF refusing to accept back due to [place])   Patient Goals and CMS Choice   CMS Medicare.gov Compare Post Acute Care list provided to:: Patient Represenative (must comment)(Marilyn (daughter)) Choice offered to / list presented to : Adult Children(Marilyn)  Expected Discharge Plan and Services Expected Discharge Plan: Assisted Living     Post Acute Care Choice: (ALF back to [facility]) Living arrangements for the past 2 months: Assisted Living Facility([facility])                                      Prior Living Arrangements/Services Living arrangements for the past 2 months: Assisted Living Facility([facility]) Lives with:: Self Patient language and need for interpreter reviewed:: Yes Do you feel safe going back to  the place where you live?: Yes      Need for Family Participation in Patient Care: Yes (Comment) Care giver support system in place?: Yes (comment)   Criminal Activity/Legal Involvement Pertinent to Current Situation/Hospitalization: No - Comment as needed  Activities of Daily Living Home Assistive Devices/Equipment: None ADL Screening (condition at time of admission) Patient's cognitive ability adequate to safely complete daily activities?: Yes Is the patient deaf or have difficulty hearing?: No Does the patient have difficulty seeing, even when wearing glasses/contacts?: No Does the patient have difficulty concentrating, remembering, or making decisions?: Yes Patient able to express need for assistance with ADLs?: Yes Does the patient have difficulty dressing or bathing?: Yes Independently performs ADLs?: No Communication: Independent Dressing (OT): Needs assistance Does the patient have difficulty walking or climbing stairs?: No Weakness of Legs: None Weakness of Arms/Hands: None  Permission Sought/Granted Permission sought to share information with : Case Manager, Customer service manager, Family Supports Permission granted to share information with : Yes, Verbal Permission Granted  Share Information with NAME: Leda Gauze  Permission granted to share info w AGENCY: SNFs  Permission granted to share info w Relationship: daughter  Permission granted to share info w Contact Information: [phone]  Emotional Assessment Appearance:: Other (Comment Required(unable to assess - remote) Attitude/Demeanor/Rapport: Unable to Assess Affect (typically observed): Unable to Assess Orientation: : Oriented to Place, Oriented to Self Alcohol / Substance Use: Not Applicable Psych Involvement: No (comment)  Admission diagnosis:  Nausea & vomiting [R11.2] Diarrhea, unspecified type [R19.7] Nausea and vomiting, intractability of vomiting not specified, unspecified vomiting type  [R11.2] COVID-19 virus infection [U07.1] Patient Active Problem List   Diagnosis Date Noted  . Nausea & vomiting 09/24/2018  . ARF (acute renal failure) ([place]) 09/24/2018  . Dementia ([place]) 09/24/2018  . COVID-19 virus infection 09/24/2018  . Traumatic rhabdomyolysis ([place]) 05/27/2017  . Acute lower UTI 05/27/2017  . Diarrhea 05/27/2017  . Orthostatic hypotension 05/27/2017  . Fall 05/26/2017  . Gait disorder 11/29/2016  . Memory deficits 05/20/2013   PCP:  [name], MD Pharmacy:   [facility] [phone] [place], [place] - [address] [place] [address] Phone: [phone] Fax: [phone]  [place], [place] - [address] [address] [facility] [address] [place] [address] Phone: [phone] Fax: [phone]     Social Determinants of Health (SDOH) Interventions    Readmission Risk Interventions No flowsheet data found.

## 2018-09-25 NOTE — Progress Notes (Signed)
Called  Daughter at [phone], to confirm patient  arrival no answer.

## 2018-09-25 NOTE — Progress Notes (Signed)
As this Probation officer was giving report to oncoming shift, observed patient leg hanging over side rail.  Patient had removed PIV, and telemetry. Reoriented to place, however, patient continues to attempt to get out of bed. Bed alarm audible. Oncoming shift RN to follow up.

## 2018-09-26 DIAGNOSIS — R269 Unspecified abnormalities of gait and mobility: Secondary | ICD-10-CM | POA: Diagnosis not present

## 2018-09-26 DIAGNOSIS — R634 Abnormal weight loss: Secondary | ICD-10-CM | POA: Diagnosis not present

## 2018-09-26 DIAGNOSIS — F329 Major depressive disorder, single episode, unspecified: Secondary | ICD-10-CM | POA: Diagnosis not present

## 2018-09-26 DIAGNOSIS — R197 Diarrhea, unspecified: Secondary | ICD-10-CM | POA: Diagnosis not present

## 2018-09-26 LAB — INTERLEUKIN-6, PLASMA: Interleukin-6, Plasma: 11.2 pg/mL (ref 0.0–12.2)

## 2018-09-26 NOTE — Progress Notes (Signed)
Called and spoke with patient's daughter.  Gave update on patient's progress and all questions answered.  Patient was able to Face Time with daughter.  Earleen Reaper RN

## 2018-09-26 NOTE — Progress Notes (Signed)
[facility] PROGRESS NOTE  Monica [name]  EVO:[id number] DOB: May 02, 1927 DOA: 09/24/2018 PCP: [name], MD Brief Narrative: Monica [name] is an 85 y.o. female with a history of Alzheimer's dementia, HLD, hypothyroidism who presented from [facility] on her birthday with nausea and vomiting, possibly also diarrhea found to have AKI and covid-positive with no pulmonary symptoms. She was admitted to [facility], put on IV fluids with improvement in creatinine back to normal. She has been tolerating per oral intake without vomiting albeit with poor per oral intake.   Subjective: No diarrhea, no vomiting. Eating only a little. No fevers, chills. Has no complaints other than feeling weak.   Objective: BP (!) 102/53 (BP Location: Left Arm)   Pulse 70   Temp 99 F (37.2 C) (Oral)   Resp (!) 22   Ht 5\' 4"  (1.626 m)   Wt 68 kg   SpO2 98%   BMI 25.73 kg/m   Gen: Elderly female in NAD HEENT: MMM, posterior oropharynx clear Pulm: Non-labored; CTAB, no wheezes  CV: Regular rate, no murmur appreciated; distal pulses intact/symmetric GI: + BS; soft, non-tender, non-distended Skin: No rashes, wounds, ulcers Neuro: Alert, not oriented  Assessment & Plan: AKI: Creatinine back to baseline with overnight IV fluids. Suspect prerenal etiology due to poor per oral intake possibly due to covid infection.  - Tolerating po, recheck BMP 5/31  Nausea, vomiting: Currently tolerating po.  Covid-19 infection: Likely cause of mild GI symptoms, primarily poor appetite. No pulmonary symptoms or inflammatory marker elevations.  - Continue airborne, contact precautions. PPE including surgical gown, gloves, face shield, cap, shoe covers, and N-95 used during this encounter in a negative pressure room.  - Avoid NSAIDs.  Alzheimer's dementia:  - Continue home medications - Delirium precautions.   Hypothyroidism:  - No change to synthroid  Hyperlipidemia:  - Continuing statin  Patient is stable  for discharge to [facility]. Per CSW, they are currently requiring 3 negative tests to take the patient back. Will retest now.  [name], MD [facility] [email or web address] Password [email or web address] 09/26/2018, 10:58 AM

## 2018-09-26 NOTE — Progress Notes (Signed)
Has pulled off clothing and monitoring devices, attempting to climb out of bed. Unable to redirect. MD notified of same.

## 2018-09-27 LAB — BASIC METABOLIC PANEL
Anion gap: 8 (ref 5–15)
BUN: 18 mg/dL (ref 8–23)
CO2: 24 mmol/L (ref 22–32)
Calcium: 8.7 mg/dL — ABNORMAL LOW (ref 8.9–10.3)
Chloride: 106 mmol/L (ref 98–111)
Creatinine, Ser: 0.8 mg/dL (ref 0.44–1.00)
GFR calc Af Amer: >60 mL/min (ref >60)
GFR calc non Af Amer: >60 mL/min (ref >60)
Glucose, Bld: 89 mg/dL (ref 70–99)
Potassium: 3.7 mmol/L (ref 3.5–5.1)
Sodium: 138 mmol/L (ref 135–145)

## 2018-09-27 MED ORDER — ENOXAPARIN SODIUM 40 MG/0.4ML ~~LOC~~ SOLN
40.0000 mg | SUBCUTANEOUS | Status: DC
  Administered 2018-09-27 – 2018-09-28 (×2): 40 mg via SUBCUTANEOUS
  Filled 2018-09-27 (×2): qty 0.4

## 2018-09-27 NOTE — Progress Notes (Signed)
Spoke with pt's daughter, Leda Gauze via telephone. Gave update. Daughter denied questions.

## 2018-09-27 NOTE — Progress Notes (Signed)
[facility] PROGRESS NOTE  [name]  [id number]:[id number] DOB: Jun 21, 1927 DOA: 09/24/2018 PCP: [name], MD Brief Narrative: Monica [name] is an 85 y.o. female with a history of Alzheimer's dementia, HLD, hypothyroidism who presented from [facility] on her birthday with nausea and vomiting, possibly also diarrhea found to have AKI and covid-positive with no pulmonary symptoms. She was admitted to [facility], put on IV fluids with improvement in creatinine back to normal. She has been tolerating per oral intake without vomiting albeit with poor per oral intake.   Subjective: No complaints, ate more than previously for breakfast this morning. No vomiting or diarrhea noted.  Objective: BP (!) 112/48   Pulse 64   Temp 98.3 F (36.8 C) (Oral)   Resp (!) 22   Ht 5\' 4"  (1.626 m)   Wt 68 kg   SpO2 98%   BMI 25.73 kg/m   Elderly in no distress Clear, nonlabored Abd soft, NT, ND, +BS  Assessment & Plan: AKI: Creatinine back to baseline with overnight IV fluids. Suspect prerenal etiology due to poor per oral intake possibly due to covid infection.  - Tolerating po, stopped IVF's and creatinine continued to trend downward, 0.8 this morning.   Nausea, vomiting: Currently tolerating po.  Covid-19 infection: Likely cause of mild GI symptoms, primarily poor appetite. No pulmonary symptoms or inflammatory marker elevations.  - Continue airborne, contact precautions. PPE including surgical gown, gloves, face shield, cap, shoe covers, and N-95 used during this encounter in a negative pressure room.  - Avoid NSAIDs.  Alzheimer's dementia:  - Continue home medications - Delirium precautions, discharge as soon as possible.   Hypothyroidism:  - No change to synthroid  Hyperlipidemia:  - Continuing statin  Patient is stable for discharge to [facility]. Per CSW, they are currently requiring 3 negative tests to take the patient back. Retest from 5/30 is pending.   Monica Pour,  MD [email or web address] Password [email or web address] 09/27/2018, 12:12 PM

## 2018-09-28 NOTE — Progress Notes (Signed)
[facility] PROGRESS NOTE  Monica [name]  [id number]:[id number] DOB: 07/10/1927 DOA: 09/24/2018 PCP: [name], MD Brief Narrative: Monica [name] is an 85 y.o. female with a history of Alzheimer's dementia, HLD, hypothyroidism who presented from [facility] on her birthday with nausea and vomiting, possibly also diarrhea found to have AKI and covid-positive with no pulmonary symptoms. She was admitted to [facility], put on IV fluids with improvement in creatinine back to normal. She has been tolerating per oral intake without vomiting albeit with poor per oral intake. Kidney function continued improvement after stopping IV fluids and she has been medically stable for discharge. The facility from whence she came will not accept her back until she has tested negative for covid-19. Last test sent 5/30, still pending.  Subjective: No complaints, sitter at bedside reports poor per oral intake this morning compared to last night. She's complained of no pain, trouble breathing, and no nausea. No vomiting or diarrhea noted.  Objective: BP (!) 112/56 (BP Location: Left Arm)   Pulse 64   Temp 97.9 F (36.6 C) (Oral)   Resp (!) 21   Ht 5\' 4"  (1.626 m)   Wt 68 kg   SpO2 97%   BMI 25.73 kg/m   Elderly female in no distress resting in bed quietly.  Nonlabored  Assessment & Plan: AKI: Creatinine back to baseline with overnight IV fluids. Suspect prerenal etiology due to poor per oral intake possibly due to covid infection.  - Tolerating po, stopped IVF's and creatinine continued to trend downward. Will check intermittently.  Nausea, vomiting: Currently tolerating po.  Covid-19 infection: Likely cause of mild GI symptoms, primarily poor appetite. No pulmonary symptoms or inflammatory marker elevations.  - Continue airborne, contact precautions while admitted. PPE including surgical gown, gloves, face shield, cap, shoe covers, and N-95 used during this encounter in a negative pressure room.  -  Avoid NSAIDs.  Alzheimer's dementia:  - Continue home medications - Delirium precautions, discharge as soon as possible.   Hypothyroidism:  - No change to synthroid  Hyperlipidemia:  - Continuing statin  Patient is stable for discharge to [facility]. Per CSW, they are currently requiring 3 negative tests to take the patient back. Retest from 5/30 is pending.   [name], MD [email or web address] Password [email or web address] 09/28/2018, 11:07 AM

## 2018-09-28 NOTE — Progress Notes (Addendum)
Pt started yelling at this RN "i'm falling help me help me". Pt shaking bedrails and yelling while laying flat in the bed. Pt reoriented to the hospital and her situation Pt continuing to yell "help me I'm falling". RN able to calm pt down after a few minutes of encouraging pt to breathe slowly and deeply.

## 2018-09-28 NOTE — Progress Notes (Signed)
Pt able to facetime with her daughter, Leda Gauze.

## 2018-09-28 NOTE — NC FL2 (Signed)
[place] LEVEL OF CARE SCREENING TOOL     IDENTIFICATION  Patient Name: Monica [name] Birthdate: 03-23-1928 Sex: female Admission Date (Current Location): 09/24/2018  [facility] and [place] Number:  Herbalist and Address:  [facility], [place] [address], [place], [place] [address]([place])      Provider Number: [id number]  Attending Physician Name and Address:  [name], MD  Relative Name and Phone Number:  Leda Gauze [phone]    Current Level of Care: Hospital Recommended Level of Care: [place] Prior Approval Number:    Date Approved/Denied:   PASRR Number: [id number]  Discharge Plan: SNF    Current Diagnoses: Patient Active Problem List   Diagnosis Date Noted  . Nausea & vomiting 09/24/2018  . ARF (acute renal failure) ([place]) 09/24/2018  . Dementia ([place]) 09/24/2018  . COVID-19 virus infection 09/24/2018  . Traumatic rhabdomyolysis ([place]) 05/27/2017  . Acute lower UTI 05/27/2017  . Diarrhea 05/27/2017  . Orthostatic hypotension 05/27/2017  . Fall 05/26/2017  . Gait disorder 11/29/2016  . Memory deficits 05/20/2013    Orientation RESPIRATION BLADDER Height & Weight     Self, Place  Normal Incontinent, External catheter Weight: 149 lb 14.6 oz (68 kg) Height:  5\' 4"  (162.6 cm)  BEHAVIORAL SYMPTOMS/MOOD NEUROLOGICAL BOWEL NUTRITION STATUS      Incontinent Diet(see discharge summary)  AMBULATORY STATUS COMMUNICATION OF NEEDS Skin   Extensive Assist Verbally Other (Comment)(MASD on buttocks, vagina and perineum)                       Personal Care Assistance Level of Assistance  Bathing, Feeding, Dressing, Total care Bathing Assistance: Limited assistance Feeding assistance: Limited assistance Dressing Assistance: Limited assistance Total Care Assistance: Maximum assistance   Functional Limitations Info  Sight, Hearing, Speech Sight Info: Adequate Hearing Info:  Adequate Speech Info: Adequate    SPECIAL CARE FACTORS FREQUENCY  PT (By licensed PT), OT (By licensed OT)     PT Frequency: min 5x weekly OT Frequency: min 5x weekly            Contractures Contractures Info: Not present    Additional Factors Info  Code Status, Allergies, Isolation Precautions Code Status Info: full Allergies Info: Butylaminobenzoyldiethylaminoethyl, Ciprofloxacin, Erythromycin, Hydralazine, Namenda (memantine Hcl), Prednisone, Sulfa Antibiotics     Isolation Precautions Info: Air and contact precautions, emerging pathogen     Current Medications (09/28/2018):  This is the current hospital active medication list Current Facility-Administered Medications  Medication Dose Route Frequency Provider Last Rate Last Dose  . acetaminophen (TYLENOL) tablet 650 mg  650 mg Oral Q6H PRN [name], MD   650 mg at 09/26/18 2022   Or  . acetaminophen (TYLENOL) suppository 650 mg  650 mg Rectal Q6H PRN [name], MD      . busPIRone (BUSPAR) tablet 5 mg  5 mg Oral BID [name], MD   5 mg at 09/28/18 0843  . donepezil (ARICEPT) tablet 5 mg  5 mg Oral QHS [name], MD   5 mg at 09/27/18 2201  . enoxaparin (LOVENOX) injection 40 mg  40 mg Subcutaneous Q24H Franky Macho, RPH   40 mg at 09/27/18 2201  . FLUoxetine (PROZAC) capsule 20 mg  20 mg Oral Daily [name], MD   20 mg at 09/28/18 0843  . levothyroxine (SYNTHROID) tablet 75 mcg  75 mcg Oral QAC breakfast [name],  MD   75 mcg at 09/28/18 0605  . ondansetron (ZOFRAN) injection 4 mg  4 mg Intravenous Q6H PRN [name], MD      . pravastatin (PRAVACHOL) tablet 40 mg  40 mg Oral Daily [name], MD   40 mg at 09/28/18 3734     Discharge Medications: Please see discharge summary for a list of discharge medications.  Relevant Imaging Results:  Relevant Lab Results:   Additional Information SSN: [id number]  Alberteen Sam, LCSW

## 2018-09-28 NOTE — Progress Notes (Signed)
Spoke with pt's daughter, Leda Gauze and gave update. No further questions.

## 2018-09-29 DIAGNOSIS — R2689 Other abnormalities of gait and mobility: Secondary | ICD-10-CM | POA: Diagnosis not present

## 2018-09-29 DIAGNOSIS — G308 Other Alzheimer's disease: Secondary | ICD-10-CM | POA: Diagnosis not present

## 2018-09-29 DIAGNOSIS — B349 Viral infection, unspecified: Secondary | ICD-10-CM | POA: Diagnosis not present

## 2018-09-29 DIAGNOSIS — S22080A Wedge compression fracture of T11-T12 vertebra, initial encounter for closed fracture: Secondary | ICD-10-CM | POA: Diagnosis not present

## 2018-09-29 DIAGNOSIS — Z7401 Bed confinement status: Secondary | ICD-10-CM | POA: Diagnosis not present

## 2018-09-29 DIAGNOSIS — R41841 Cognitive communication deficit: Secondary | ICD-10-CM | POA: Diagnosis not present

## 2018-09-29 DIAGNOSIS — R911 Solitary pulmonary nodule: Secondary | ICD-10-CM | POA: Diagnosis not present

## 2018-09-29 DIAGNOSIS — R112 Nausea with vomiting, unspecified: Secondary | ICD-10-CM | POA: Diagnosis not present

## 2018-09-29 DIAGNOSIS — R630 Anorexia: Secondary | ICD-10-CM | POA: Diagnosis not present

## 2018-09-29 DIAGNOSIS — R269 Unspecified abnormalities of gait and mobility: Secondary | ICD-10-CM | POA: Diagnosis not present

## 2018-09-29 DIAGNOSIS — R293 Abnormal posture: Secondary | ICD-10-CM | POA: Diagnosis not present

## 2018-09-29 DIAGNOSIS — M6281 Muscle weakness (generalized): Secondary | ICD-10-CM | POA: Diagnosis not present

## 2018-09-29 DIAGNOSIS — N200 Calculus of kidney: Secondary | ICD-10-CM | POA: Diagnosis not present

## 2018-09-29 DIAGNOSIS — U071 COVID-19: Secondary | ICD-10-CM | POA: Diagnosis not present

## 2018-09-29 DIAGNOSIS — R0902 Hypoxemia: Secondary | ICD-10-CM | POA: Diagnosis not present

## 2018-09-29 DIAGNOSIS — R2681 Unsteadiness on feet: Secondary | ICD-10-CM | POA: Diagnosis not present

## 2018-09-29 DIAGNOSIS — E038 Other specified hypothyroidism: Secondary | ICD-10-CM | POA: Diagnosis not present

## 2018-09-29 DIAGNOSIS — R918 Other nonspecific abnormal finding of lung field: Secondary | ICD-10-CM | POA: Diagnosis not present

## 2018-09-29 DIAGNOSIS — F0280 Dementia in other diseases classified elsewhere without behavioral disturbance: Secondary | ICD-10-CM | POA: Diagnosis not present

## 2018-09-29 DIAGNOSIS — R278 Other lack of coordination: Secondary | ICD-10-CM | POA: Diagnosis not present

## 2018-09-29 DIAGNOSIS — E7849 Other hyperlipidemia: Secondary | ICD-10-CM | POA: Diagnosis not present

## 2018-09-29 DIAGNOSIS — E785 Hyperlipidemia, unspecified: Secondary | ICD-10-CM | POA: Diagnosis not present

## 2018-09-29 DIAGNOSIS — E039 Hypothyroidism, unspecified: Secondary | ICD-10-CM | POA: Diagnosis not present

## 2018-09-29 DIAGNOSIS — N179 Acute kidney failure, unspecified: Secondary | ICD-10-CM | POA: Diagnosis not present

## 2018-09-29 DIAGNOSIS — M2550 Pain in unspecified joint: Secondary | ICD-10-CM | POA: Diagnosis not present

## 2018-09-29 DIAGNOSIS — K8020 Calculus of gallbladder without cholecystitis without obstruction: Secondary | ICD-10-CM | POA: Diagnosis not present

## 2018-09-29 LAB — NOVEL CORONAVIRUS, NAA (HOSP ORDER, SEND-OUT TO REF LAB; TAT 18-24 HRS): SARS-CoV-2, NAA: DETECTED — AB

## 2018-09-29 NOTE — Discharge Planning (Addendum)
Patient IV and tele removed.  RN assessment and VS revealed stability for DC back to [facility].  Packet completed and [facility] called to transport at [facility], per facility request. Gave report to Bufford Lope, LPN.

## 2018-09-29 NOTE — Evaluation (Signed)
Occupational Therapy Evaluation Patient Details Name: Monica [name] MRN: [id number] DOB: [birth year] Today's Date: 09/29/2018    History of Present Illness 85 y.o. female with a history of Alzheimer's dementia, HLD, hypothyroidism who presented from [facility] 09/24/18 with nausea and vomiting, possibly also diarrhea found to have AKI and covid-positive with no pulmonary symptoms. She was admitted to [facility], put on IV fluids with improvement in creatinine back to normal. The facility from whence she came will not accept her back until she has tested negative for covid-19. Last test sent 5/30, + for virus.   Clinical Impression   PTA, pt was living at [facility] per chart review; unsure of PLOF due to confusion and poor historian. Pt currently requiring Min A for grooming in supported sitting, Max A for LB ADLs, and Max A +2 for stand pivot transfer to recliner. Pt limited by significant fear of falling and required increased encouragement and cues throughout. Pt agreeable to transfer to chair and very happy once in recliner. Pt would benefit from further acute OT to facilitate safe dc. Recommend dc to SNF for further OT to optimize safety, independence with ADLs, and return to PLOF.      Follow Up Recommendations  SNF    Equipment Recommendations  Other (comment)(Defer to next venue)    Recommendations for Other Services PT consult     Precautions / Restrictions Precautions Precautions: Fall Precaution Comments: Significant fear of falls Restrictions Weight Bearing Restrictions: No      Mobility Bed Mobility Overal bed mobility: Needs Assistance Bed Mobility: Supine to Sit     Supine to sit: Min assist;+2 for physical assistance;+2 for safety/equipment     General bed mobility comments: Min A for trunk support and Max cues to come to EOB.   Transfers Overall transfer level: Needs assistance   Transfers: Sit to/from Stand;Stand Pivot Transfers Sit to Stand: Max  assist;+2 physical assistance Stand pivot transfers: Max assist;+2 physical assistance       General transfer comment: Due to increased anxiety and fear of falling with transfer, pt     Balance Overall balance assessment: Needs assistance Sitting-balance support: Single extremity supported;Feet supported Sitting balance-Leahy Scale: Poor Sitting balance - Comments: Fluctuating between Min-Max A for sititng support. Pt requiring increased support with increased fear of falling as she would push into extenion with trunk and legs.    Standing balance support: Bilateral upper extremity supported;During functional activity Standing balance-Leahy Scale: Zero                             ADL either performed or assessed with clinical judgement   ADL Overall ADL's : Needs assistance/impaired Eating/Feeding: Set up;Supervision/ safety;Sitting   Grooming: Brushing hair;Sitting;Min guard Grooming Details (indicate cue type and reason): Pt sitting in recliner and brushing her hair with cues for attention.  Upper Body Bathing: Moderate assistance;Sitting   Lower Body Bathing: Maximal assistance;Bed level   Upper Body Dressing : Moderate assistance;Sitting   Lower Body Dressing: Maximal assistance;Bed level   Toilet Transfer: Maximal assistance;+2 for physical assistance;Stand-pivot           Functional mobility during ADLs: Maximal assistance;+2 for physical assistance(stand pivot) General ADL Comments: Pt very pleasant agreeable while in supported sitting. Pt with increased fear at EOB and during transfer     Vision Baseline Vision/History: Wears glasses Patient Visual Report: No change from baseline       Perception  Praxis      Pertinent Vitals/Pain Pain Assessment: Faces Faces Pain Scale: Hurts little more Pain Location: BLE Pain Descriptors / Indicators: Sore;Discomfort;Constant Pain Intervention(s): Monitored during session;Repositioned     Hand  Dominance     Extremity/Trunk Assessment Upper Extremity Assessment Upper Extremity Assessment: Generalized weakness   Lower Extremity Assessment Lower Extremity Assessment: Defer to PT evaluation   Cervical / Trunk Assessment Cervical / Trunk Assessment: Kyphotic   Communication Communication Communication: No difficulties   Cognition Arousal/Alertness: Awake/alert Behavior During Therapy: WFL for tasks assessed/performed;Anxious Overall Cognitive Status: History of cognitive impairments - at baseline                                 General Comments: Pt pleasant and agreeable to transfer to chair. However, increased anxiety with transfer due to significant fear of falling.    General Comments  HR 80s; BP 102/58 supine, 96/61 seated at EOB, 108/52 in recliner. SpO2 90s on RA    Exercises     Shoulder Instructions      Home Living                                   Additional Comments: Per chart review, pt from [facility]      Prior Functioning/Environment          Comments: Unsure of PLOF due to pt being poor historian.        OT Problem List: Decreased strength;Decreased range of motion;Decreased activity tolerance;Impaired balance (sitting and/or standing);Decreased knowledge of use of DME or AE;Decreased knowledge of precautions;Decreased safety awareness;Decreased cognition;Pain      OT Treatment/Interventions: Self-care/ADL training;Therapeutic exercise;Energy conservation;DME and/or AE instruction;Therapeutic activities;Patient/family education    OT Goals(Current goals can be found in the care plan section) Acute Rehab OT Goals Patient Stated Goal: "Sit in the chair" OT Goal Formulation: With patient Time For Goal Achievement: 10/13/18 Potential to Achieve Goals: Good  OT Frequency: Min 2X/week   Barriers to D/C:            Co-evaluation              AM-PAC OT "6 Clicks" Daily Activity     Outcome Measure Help  from another person eating meals?: A Little Help from another person taking care of personal grooming?: A Little Help from another person toileting, which includes using toliet, bedpan, or urinal?: A Lot Help from another person bathing (including washing, rinsing, drying)?: A Lot Help from another person to put on and taking off regular upper body clothing?: A Lot Help from another person to put on and taking off regular lower body clothing?: A Lot 6 Click Score: 14   End of Session Nurse Communication: Mobility status  Activity Tolerance: Patient tolerated treatment well;Other (comment)(Fear of falling) Patient left: in chair;with call bell/phone within reach;with nursing/sitter in room  OT Visit Diagnosis: Unsteadiness on feet (R26.81);Other abnormalities of gait and mobility (R26.89);Muscle weakness (generalized) (M62.81);Other symptoms and signs involving cognitive function;Pain                Time: 9622-2979 OT Time Calculation (min): 24 min Charges:  OT General Charges $OT Visit: 1 Visit OT Evaluation $OT Eval Moderate Complexity: [place], OTR/L Acute Rehab Pager: [phone] Office: [place] 09/29/2018, 12:35 PM

## 2018-09-29 NOTE — TOC Transition Note (Signed)
Transition of Care [facility]) - CM/SW Discharge Note   Patient Details  Name: Monica [name] MRN: [id number] Date of Birth: Feb 20, 1928  [facility] CM/SW Contact:  Weston Anna, LCSW Phone Number: [phone] 09/29/2018, 11:21 AM   Clinical Narrative:    Patient is set to discharge to [facility] today. DC summary has been completed by MD. Please call report to [phone]. Daughter, Monica [name], has been notified.   Facility request transportation to be scheduled for 4PM. Will notified RN.    Final next level of care: Skilled Nursing Facility Barriers to Discharge: No Barriers Identified   Patient Goals and CMS Choice   CMS Medicare.gov Compare Post Acute Care list provided to:: Patient Represenative (must comment)(Monica [name] (daughter)) Choice offered to / list presented to : Adult Children(Monica [name])  Discharge Placement              Patient chooses bed at: [facility] Patient to be transferred to facility by: [place] Name of family member notified: Daughter- Monica [name] Patient and family notified of of transfer: 09/29/18  Discharge Plan and Services     Post Acute Care Choice: (ALF back to [facility])                               Social Determinants of Health (SDOH) Interventions     Readmission Risk Interventions No flowsheet data found.
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
## 2018-09-29 NOTE — Evaluation (Signed)
Physical Therapy Evaluation Patient Details Name: ARABIA NYLUND MRN: [id number] DOB: 04-27-1928 Today's Date: 09/29/2018   History of Present Illness  85 y.o. female with a history of Alzheimer's dementia, HLD, hypothyroidism who presented from [facility] 09/24/18 with nausea and vomiting, possibly also diarrhea found to have AKI and covid-positive with no pulmonary symptoms. She was admitted to [facility], put on IV fluids with improvement in creatinine back to normal. The facility from whence she came will not accept her back until she has tested negative for covid-19. Last test sent 5/30, + for virus.    Clinical Impression  Pt admitted with above diagnosis. Patient currently requires +2 assist for stand-pivot to chair (primarily due to fear and posterior bias--fortunately she is not a large person and can be managed at this level). Unclear what her baseline mobility was, and pt may benefit from skilled PT to increase their independence and safety with mobility. See recommendations below.      Follow Up Recommendations SNF;Supervision/Assistance - 24 hour    Equipment Recommendations  None recommended by PT    Recommendations for Other Services       Precautions / Restrictions Precautions Precautions: Fall Precaution Comments: Significant fear of falls Restrictions Weight Bearing Restrictions: No      Mobility  Bed Mobility Overal bed mobility: Needs Assistance Bed Mobility: Supine to Sit     Supine to sit: +2 for physical assistance;+2 for safety/equipment;Mod assist     General bed mobility comments: Min A for trunk support and Max cues to come to EOB.   Transfers Overall transfer level: Needs assistance   Transfers: Sit to/from Stand;Stand Pivot Transfers Sit to Stand: Max assist;+2 physical assistance Stand pivot transfers: Max assist;+2 physical assistance       General transfer comment: Due to increased anxiety and fear of falling with transfer, pt    Ambulation/Gait             General Gait Details: not appropriate at this time due to fear of falling and posterior bias  Stairs            Wheelchair Mobility    Modified Rankin (Stroke Patients Only)       Balance Overall balance assessment: Needs assistance Sitting-balance support: Single extremity supported;Feet supported Sitting balance-Leahy Scale: Poor Sitting balance - Comments: Fluctuating between Min-Max A for sititng support. Pt requiring increased support with increased fear of falling as she would push into extenion with trunk and legs.    Standing balance support: Bilateral upper extremity supported;During functional activity Standing balance-Leahy Scale: Zero                               Pertinent Vitals/Pain Pain Assessment: Faces Faces Pain Scale: Hurts little more Pain Location: BLE Pain Descriptors / Indicators: Sore;Discomfort;Constant Pain Intervention(s): Limited activity within patient's tolerance;Monitored during session;Repositioned    Home Living                   Additional Comments: Per chart review, pt from [facility]    Prior Function           Comments: Unsure of PLOF due to pt being poor historian.     Hand Dominance        Extremity/Trunk Assessment   Upper Extremity Assessment Upper Extremity Assessment: Defer to OT evaluation    Lower Extremity Assessment Lower Extremity Assessment: Generalized weakness    Cervical / Trunk Assessment  Cervical / Trunk Assessment: Kyphotic  Communication   Communication: No difficulties  Cognition Arousal/Alertness: Awake/alert Behavior During Therapy: WFL for tasks assessed/performed;Anxious Overall Cognitive Status: History of cognitive impairments - at baseline                                 General Comments: Pt pleasant and agreeable to transfer to chair. However, increased anxiety with transfer due to significant fear of falling.        General Comments General comments (skin integrity, edema, etc.): HR 80s; BP 102/58 supine, 96/61 seated at EOB, 108/52 in recliner. SpO2 90s on RA    Exercises     Assessment/Plan    PT Assessment All further PT needs can be met in the next venue of care  PT Problem List Decreased strength;Decreased activity tolerance;Decreased balance;Decreased mobility;Decreased cognition;Decreased safety awareness;Pain       PT Treatment Interventions      PT Goals (Current goals can be found in the Care Plan section)  Acute Rehab PT Goals Patient Stated Goal: "Sit in the chair" PT Goal Formulation: All assessment and education complete, DC therapy    Frequency     Barriers to discharge        Co-evaluation               AM-PAC PT "6 Clicks" Mobility  Outcome Measure Help needed turning from your back to your side while in a flat bed without using bedrails?: A Lot Help needed moving from lying on your back to sitting on the side of a flat bed without using bedrails?: A Lot Help needed moving to and from a bed to a chair (including a wheelchair)?: A Lot Help needed standing up from a chair using your arms (e.g., wheelchair or bedside chair)?: A Lot Help needed to walk in hospital room?: Total Help needed climbing 3-5 steps with a railing? : Total 6 Click Score: 10    End of Session   Activity Tolerance: Other (comment)(patient anxious; wanted to get OOB very much but afraid) Patient left: in chair;with call bell/phone within reach;with nursing/sitter in room   PT Visit Diagnosis: Other abnormalities of gait and mobility (R26.89);Muscle weakness (generalized) (M62.81)    Time: 7289-7915 PT Time Calculation (min) (ACUTE ONLY): 24 min   Charges:   PT Evaluation $PT Eval Moderate Complexity: 1 Mod            [employer], PT 09/29/2018, 1:42 PM

## 2018-09-30 ENCOUNTER — Telehealth: Payer: Self-pay | Admitting: *Deleted

## 2018-09-30 DIAGNOSIS — E785 Hyperlipidemia, unspecified: Secondary | ICD-10-CM | POA: Diagnosis not present

## 2018-09-30 DIAGNOSIS — E039 Hypothyroidism, unspecified: Secondary | ICD-10-CM | POA: Diagnosis not present

## 2018-09-30 DIAGNOSIS — U071 COVID-19: Secondary | ICD-10-CM | POA: Diagnosis not present

## 2018-09-30 DIAGNOSIS — R630 Anorexia: Secondary | ICD-10-CM | POA: Diagnosis not present

## 2018-09-30 NOTE — Telephone Encounter (Signed)
Caller asking questions regarding this patient's medical information. She is not on the DPR and no information was given.

## 2018-10-01 DIAGNOSIS — S22080A Wedge compression fracture of T11-T12 vertebra, initial encounter for closed fracture: Secondary | ICD-10-CM | POA: Diagnosis not present

## 2018-10-01 DIAGNOSIS — R911 Solitary pulmonary nodule: Secondary | ICD-10-CM | POA: Diagnosis not present

## 2018-10-01 DIAGNOSIS — N179 Acute kidney failure, unspecified: Secondary | ICD-10-CM | POA: Diagnosis not present

## 2018-10-01 DIAGNOSIS — U071 COVID-19: Secondary | ICD-10-CM | POA: Diagnosis not present

## 2018-10-15 ENCOUNTER — Other Ambulatory Visit: Payer: Self-pay | Admitting: *Deleted

## 2018-10-15 NOTE — Patient Outreach (Signed)
Member assessed for potential [facility] Care Management program needs as a benefit of her [employer] insurance.  Member discussed in telephonic IDT meeting with [facility] SNF facility staff and [facility] UM RN.   Facility staff reports member will transition back to [facility] memory care post SNF discharge.  No identifiable [facility] Care Management needs at this time.   Writer to sign off.  Marthenia Rolling, MSN-Ed, RN,BSN [place] Acute Care Coordinator [phone]

## 2018-10-29 DIAGNOSIS — K8020 Calculus of gallbladder without cholecystitis without obstruction: Secondary | ICD-10-CM | POA: Diagnosis not present

## 2018-10-29 DIAGNOSIS — G308 Other Alzheimer's disease: Secondary | ICD-10-CM | POA: Diagnosis not present

## 2018-10-29 DIAGNOSIS — E038 Other specified hypothyroidism: Secondary | ICD-10-CM | POA: Diagnosis not present

## 2018-10-29 DIAGNOSIS — N200 Calculus of kidney: Secondary | ICD-10-CM | POA: Diagnosis not present

## 2018-10-29 DIAGNOSIS — F0280 Dementia in other diseases classified elsewhere without behavioral disturbance: Secondary | ICD-10-CM | POA: Diagnosis not present

## 2018-10-29 DIAGNOSIS — R630 Anorexia: Secondary | ICD-10-CM | POA: Diagnosis not present

## 2018-10-29 DIAGNOSIS — S22080A Wedge compression fracture of T11-T12 vertebra, initial encounter for closed fracture: Secondary | ICD-10-CM | POA: Diagnosis not present

## 2018-10-29 DIAGNOSIS — E7849 Other hyperlipidemia: Secondary | ICD-10-CM | POA: Diagnosis not present

## 2018-10-29 DIAGNOSIS — R918 Other nonspecific abnormal finding of lung field: Secondary | ICD-10-CM | POA: Diagnosis not present

## 2018-10-29 DIAGNOSIS — U071 COVID-19: Secondary | ICD-10-CM | POA: Diagnosis not present

## 2018-11-03 DIAGNOSIS — E039 Hypothyroidism, unspecified: Secondary | ICD-10-CM | POA: Diagnosis not present

## 2018-11-03 DIAGNOSIS — R911 Solitary pulmonary nodule: Secondary | ICD-10-CM | POA: Diagnosis not present

## 2018-11-03 DIAGNOSIS — I951 Orthostatic hypotension: Secondary | ICD-10-CM | POA: Diagnosis not present

## 2018-11-03 DIAGNOSIS — S22081D Stable burst fracture of T11-T12 vertebra, subsequent encounter for fracture with routine healing: Secondary | ICD-10-CM | POA: Diagnosis not present

## 2018-11-03 DIAGNOSIS — F329 Major depressive disorder, single episode, unspecified: Secondary | ICD-10-CM | POA: Diagnosis not present

## 2018-11-03 DIAGNOSIS — F0280 Dementia in other diseases classified elsewhere without behavioral disturbance: Secondary | ICD-10-CM | POA: Diagnosis not present

## 2018-11-03 DIAGNOSIS — M6281 Muscle weakness (generalized): Secondary | ICD-10-CM | POA: Diagnosis not present

## 2018-11-03 DIAGNOSIS — N179 Acute kidney failure, unspecified: Secondary | ICD-10-CM | POA: Diagnosis not present

## 2018-11-03 DIAGNOSIS — U071 COVID-19: Secondary | ICD-10-CM | POA: Diagnosis not present

## 2018-11-03 DIAGNOSIS — E785 Hyperlipidemia, unspecified: Secondary | ICD-10-CM | POA: Diagnosis not present

## 2018-11-03 DIAGNOSIS — G309 Alzheimer's disease, unspecified: Secondary | ICD-10-CM | POA: Diagnosis not present

## 2018-11-03 DIAGNOSIS — M1990 Unspecified osteoarthritis, unspecified site: Secondary | ICD-10-CM | POA: Diagnosis not present

## 2018-11-03 DIAGNOSIS — C679 Malignant neoplasm of bladder, unspecified: Secondary | ICD-10-CM | POA: Diagnosis not present

## 2018-11-09 DIAGNOSIS — S22081D Stable burst fracture of T11-T12 vertebra, subsequent encounter for fracture with routine healing: Secondary | ICD-10-CM | POA: Diagnosis not present

## 2018-11-09 DIAGNOSIS — M6281 Muscle weakness (generalized): Secondary | ICD-10-CM | POA: Diagnosis not present

## 2018-11-09 DIAGNOSIS — U071 COVID-19: Secondary | ICD-10-CM | POA: Diagnosis not present

## 2018-11-09 DIAGNOSIS — N179 Acute kidney failure, unspecified: Secondary | ICD-10-CM | POA: Diagnosis not present

## 2018-11-09 DIAGNOSIS — G309 Alzheimer's disease, unspecified: Secondary | ICD-10-CM | POA: Diagnosis not present

## 2018-11-09 DIAGNOSIS — F0280 Dementia in other diseases classified elsewhere without behavioral disturbance: Secondary | ICD-10-CM | POA: Diagnosis not present

## 2018-11-12 DIAGNOSIS — F0280 Dementia in other diseases classified elsewhere without behavioral disturbance: Secondary | ICD-10-CM | POA: Diagnosis not present

## 2018-11-12 DIAGNOSIS — S22081D Stable burst fracture of T11-T12 vertebra, subsequent encounter for fracture with routine healing: Secondary | ICD-10-CM | POA: Diagnosis not present

## 2018-11-12 DIAGNOSIS — U071 COVID-19: Secondary | ICD-10-CM | POA: Diagnosis not present

## 2018-11-12 DIAGNOSIS — M6281 Muscle weakness (generalized): Secondary | ICD-10-CM | POA: Diagnosis not present

## 2018-11-12 DIAGNOSIS — G309 Alzheimer's disease, unspecified: Secondary | ICD-10-CM | POA: Diagnosis not present

## 2018-11-12 DIAGNOSIS — N179 Acute kidney failure, unspecified: Secondary | ICD-10-CM | POA: Diagnosis not present

## 2018-11-17 DIAGNOSIS — F0280 Dementia in other diseases classified elsewhere without behavioral disturbance: Secondary | ICD-10-CM | POA: Diagnosis not present

## 2018-11-17 DIAGNOSIS — S22081D Stable burst fracture of T11-T12 vertebra, subsequent encounter for fracture with routine healing: Secondary | ICD-10-CM | POA: Diagnosis not present

## 2018-11-17 DIAGNOSIS — N179 Acute kidney failure, unspecified: Secondary | ICD-10-CM | POA: Diagnosis not present

## 2018-11-17 DIAGNOSIS — G309 Alzheimer's disease, unspecified: Secondary | ICD-10-CM | POA: Diagnosis not present

## 2018-11-17 DIAGNOSIS — U071 COVID-19: Secondary | ICD-10-CM | POA: Diagnosis not present

## 2018-11-17 DIAGNOSIS — M6281 Muscle weakness (generalized): Secondary | ICD-10-CM | POA: Diagnosis not present

## 2018-11-18 DIAGNOSIS — R269 Unspecified abnormalities of gait and mobility: Secondary | ICD-10-CM | POA: Diagnosis not present

## 2018-11-18 DIAGNOSIS — E782 Mixed hyperlipidemia: Secondary | ICD-10-CM | POA: Diagnosis not present

## 2018-11-18 DIAGNOSIS — F0390 Unspecified dementia without behavioral disturbance: Secondary | ICD-10-CM | POA: Diagnosis not present

## 2018-11-18 DIAGNOSIS — F329 Major depressive disorder, single episode, unspecified: Secondary | ICD-10-CM | POA: Diagnosis not present

## 2018-11-18 DIAGNOSIS — R634 Abnormal weight loss: Secondary | ICD-10-CM | POA: Diagnosis not present

## 2018-11-18 DIAGNOSIS — E039 Hypothyroidism, unspecified: Secondary | ICD-10-CM | POA: Diagnosis not present

## 2018-11-19 DIAGNOSIS — G309 Alzheimer's disease, unspecified: Secondary | ICD-10-CM | POA: Diagnosis not present

## 2018-11-19 DIAGNOSIS — U071 COVID-19: Secondary | ICD-10-CM | POA: Diagnosis not present

## 2018-11-19 DIAGNOSIS — F0280 Dementia in other diseases classified elsewhere without behavioral disturbance: Secondary | ICD-10-CM | POA: Diagnosis not present

## 2018-11-19 DIAGNOSIS — M6281 Muscle weakness (generalized): Secondary | ICD-10-CM | POA: Diagnosis not present

## 2018-11-19 DIAGNOSIS — S22081D Stable burst fracture of T11-T12 vertebra, subsequent encounter for fracture with routine healing: Secondary | ICD-10-CM | POA: Diagnosis not present

## 2018-11-19 DIAGNOSIS — N179 Acute kidney failure, unspecified: Secondary | ICD-10-CM | POA: Diagnosis not present

## 2018-11-24 DIAGNOSIS — N179 Acute kidney failure, unspecified: Secondary | ICD-10-CM | POA: Diagnosis not present

## 2018-11-24 DIAGNOSIS — S22081D Stable burst fracture of T11-T12 vertebra, subsequent encounter for fracture with routine healing: Secondary | ICD-10-CM | POA: Diagnosis not present

## 2018-11-24 DIAGNOSIS — F0280 Dementia in other diseases classified elsewhere without behavioral disturbance: Secondary | ICD-10-CM | POA: Diagnosis not present

## 2018-11-24 DIAGNOSIS — G309 Alzheimer's disease, unspecified: Secondary | ICD-10-CM | POA: Diagnosis not present

## 2018-11-24 DIAGNOSIS — F0281 Dementia in other diseases classified elsewhere with behavioral disturbance: Secondary | ICD-10-CM | POA: Diagnosis not present

## 2018-11-24 DIAGNOSIS — F331 Major depressive disorder, recurrent, moderate: Secondary | ICD-10-CM | POA: Diagnosis not present

## 2018-11-24 DIAGNOSIS — F064 Anxiety disorder due to known physiological condition: Secondary | ICD-10-CM | POA: Diagnosis not present

## 2018-11-24 DIAGNOSIS — M6281 Muscle weakness (generalized): Secondary | ICD-10-CM | POA: Diagnosis not present

## 2018-11-24 DIAGNOSIS — U071 COVID-19: Secondary | ICD-10-CM | POA: Diagnosis not present

## 2018-11-25 DIAGNOSIS — K5901 Slow transit constipation: Secondary | ICD-10-CM | POA: Diagnosis not present

## 2018-11-25 DIAGNOSIS — R634 Abnormal weight loss: Secondary | ICD-10-CM | POA: Diagnosis not present

## 2018-11-25 DIAGNOSIS — E782 Mixed hyperlipidemia: Secondary | ICD-10-CM | POA: Diagnosis not present

## 2018-11-25 DIAGNOSIS — R269 Unspecified abnormalities of gait and mobility: Secondary | ICD-10-CM | POA: Diagnosis not present

## 2018-11-25 DIAGNOSIS — S51812A Laceration without foreign body of left forearm, initial encounter: Secondary | ICD-10-CM | POA: Diagnosis not present

## 2018-11-25 DIAGNOSIS — F0390 Unspecified dementia without behavioral disturbance: Secondary | ICD-10-CM | POA: Diagnosis not present

## 2018-11-25 DIAGNOSIS — F329 Major depressive disorder, single episode, unspecified: Secondary | ICD-10-CM | POA: Diagnosis not present

## 2018-11-25 DIAGNOSIS — E039 Hypothyroidism, unspecified: Secondary | ICD-10-CM | POA: Diagnosis not present

## 2018-11-25 DIAGNOSIS — M170 Bilateral primary osteoarthritis of knee: Secondary | ICD-10-CM | POA: Diagnosis not present

## 2018-11-26 DIAGNOSIS — S22081D Stable burst fracture of T11-T12 vertebra, subsequent encounter for fracture with routine healing: Secondary | ICD-10-CM | POA: Diagnosis not present

## 2018-11-26 DIAGNOSIS — F0280 Dementia in other diseases classified elsewhere without behavioral disturbance: Secondary | ICD-10-CM | POA: Diagnosis not present

## 2018-11-26 DIAGNOSIS — U071 COVID-19: Secondary | ICD-10-CM | POA: Diagnosis not present

## 2018-11-26 DIAGNOSIS — N179 Acute kidney failure, unspecified: Secondary | ICD-10-CM | POA: Diagnosis not present

## 2018-11-26 DIAGNOSIS — M6281 Muscle weakness (generalized): Secondary | ICD-10-CM | POA: Diagnosis not present

## 2018-11-26 DIAGNOSIS — G309 Alzheimer's disease, unspecified: Secondary | ICD-10-CM | POA: Diagnosis not present

## 2018-11-27 ENCOUNTER — Other Ambulatory Visit: Payer: Self-pay

## 2018-12-01 DIAGNOSIS — U071 COVID-19: Secondary | ICD-10-CM | POA: Diagnosis not present

## 2018-12-01 DIAGNOSIS — G309 Alzheimer's disease, unspecified: Secondary | ICD-10-CM | POA: Diagnosis not present

## 2018-12-01 DIAGNOSIS — N179 Acute kidney failure, unspecified: Secondary | ICD-10-CM | POA: Diagnosis not present

## 2018-12-01 DIAGNOSIS — F0280 Dementia in other diseases classified elsewhere without behavioral disturbance: Secondary | ICD-10-CM | POA: Diagnosis not present

## 2018-12-01 DIAGNOSIS — M6281 Muscle weakness (generalized): Secondary | ICD-10-CM | POA: Diagnosis not present

## 2018-12-01 DIAGNOSIS — S22081D Stable burst fracture of T11-T12 vertebra, subsequent encounter for fracture with routine healing: Secondary | ICD-10-CM | POA: Diagnosis not present

## 2018-12-02 DIAGNOSIS — R634 Abnormal weight loss: Secondary | ICD-10-CM | POA: Diagnosis not present

## 2018-12-02 DIAGNOSIS — F329 Major depressive disorder, single episode, unspecified: Secondary | ICD-10-CM | POA: Diagnosis not present

## 2018-12-02 DIAGNOSIS — S51812A Laceration without foreign body of left forearm, initial encounter: Secondary | ICD-10-CM | POA: Diagnosis not present

## 2018-12-02 DIAGNOSIS — K5901 Slow transit constipation: Secondary | ICD-10-CM | POA: Diagnosis not present

## 2018-12-02 DIAGNOSIS — F0390 Unspecified dementia without behavioral disturbance: Secondary | ICD-10-CM | POA: Diagnosis not present

## 2018-12-03 DIAGNOSIS — I951 Orthostatic hypotension: Secondary | ICD-10-CM | POA: Diagnosis not present

## 2018-12-03 DIAGNOSIS — G309 Alzheimer's disease, unspecified: Secondary | ICD-10-CM | POA: Diagnosis not present

## 2018-12-03 DIAGNOSIS — M1990 Unspecified osteoarthritis, unspecified site: Secondary | ICD-10-CM | POA: Diagnosis not present

## 2018-12-03 DIAGNOSIS — E785 Hyperlipidemia, unspecified: Secondary | ICD-10-CM | POA: Diagnosis not present

## 2018-12-03 DIAGNOSIS — C679 Malignant neoplasm of bladder, unspecified: Secondary | ICD-10-CM | POA: Diagnosis not present

## 2018-12-03 DIAGNOSIS — S22081D Stable burst fracture of T11-T12 vertebra, subsequent encounter for fracture with routine healing: Secondary | ICD-10-CM | POA: Diagnosis not present

## 2018-12-03 DIAGNOSIS — U071 COVID-19: Secondary | ICD-10-CM | POA: Diagnosis not present

## 2018-12-03 DIAGNOSIS — M6281 Muscle weakness (generalized): Secondary | ICD-10-CM | POA: Diagnosis not present

## 2018-12-03 DIAGNOSIS — F0280 Dementia in other diseases classified elsewhere without behavioral disturbance: Secondary | ICD-10-CM | POA: Diagnosis not present

## 2018-12-03 DIAGNOSIS — E039 Hypothyroidism, unspecified: Secondary | ICD-10-CM | POA: Diagnosis not present

## 2018-12-03 DIAGNOSIS — N179 Acute kidney failure, unspecified: Secondary | ICD-10-CM | POA: Diagnosis not present

## 2018-12-03 DIAGNOSIS — F329 Major depressive disorder, single episode, unspecified: Secondary | ICD-10-CM | POA: Diagnosis not present

## 2018-12-03 DIAGNOSIS — R911 Solitary pulmonary nodule: Secondary | ICD-10-CM | POA: Diagnosis not present

## 2018-12-04 DIAGNOSIS — N179 Acute kidney failure, unspecified: Secondary | ICD-10-CM | POA: Diagnosis not present

## 2018-12-04 DIAGNOSIS — U071 COVID-19: Secondary | ICD-10-CM | POA: Diagnosis not present

## 2018-12-04 DIAGNOSIS — F0280 Dementia in other diseases classified elsewhere without behavioral disturbance: Secondary | ICD-10-CM | POA: Diagnosis not present

## 2018-12-04 DIAGNOSIS — S22081D Stable burst fracture of T11-T12 vertebra, subsequent encounter for fracture with routine healing: Secondary | ICD-10-CM | POA: Diagnosis not present

## 2018-12-04 DIAGNOSIS — G309 Alzheimer's disease, unspecified: Secondary | ICD-10-CM | POA: Diagnosis not present

## 2018-12-04 DIAGNOSIS — M6281 Muscle weakness (generalized): Secondary | ICD-10-CM | POA: Diagnosis not present

## 2018-12-07 DIAGNOSIS — S22081D Stable burst fracture of T11-T12 vertebra, subsequent encounter for fracture with routine healing: Secondary | ICD-10-CM | POA: Diagnosis not present

## 2018-12-07 DIAGNOSIS — F0280 Dementia in other diseases classified elsewhere without behavioral disturbance: Secondary | ICD-10-CM | POA: Diagnosis not present

## 2018-12-07 DIAGNOSIS — M6281 Muscle weakness (generalized): Secondary | ICD-10-CM | POA: Diagnosis not present

## 2018-12-07 DIAGNOSIS — U071 COVID-19: Secondary | ICD-10-CM | POA: Diagnosis not present

## 2018-12-07 DIAGNOSIS — G309 Alzheimer's disease, unspecified: Secondary | ICD-10-CM | POA: Diagnosis not present

## 2018-12-07 DIAGNOSIS — N179 Acute kidney failure, unspecified: Secondary | ICD-10-CM | POA: Diagnosis not present

## 2018-12-08 DIAGNOSIS — F0281 Dementia in other diseases classified elsewhere with behavioral disturbance: Secondary | ICD-10-CM | POA: Diagnosis not present

## 2018-12-08 DIAGNOSIS — G301 Alzheimer's disease with late onset: Secondary | ICD-10-CM | POA: Diagnosis not present

## 2018-12-08 DIAGNOSIS — E782 Mixed hyperlipidemia: Secondary | ICD-10-CM | POA: Diagnosis not present

## 2018-12-08 DIAGNOSIS — F064 Anxiety disorder due to known physiological condition: Secondary | ICD-10-CM | POA: Diagnosis not present

## 2018-12-08 DIAGNOSIS — F331 Major depressive disorder, recurrent, moderate: Secondary | ICD-10-CM | POA: Diagnosis not present

## 2018-12-10 DIAGNOSIS — N179 Acute kidney failure, unspecified: Secondary | ICD-10-CM | POA: Diagnosis not present

## 2018-12-10 DIAGNOSIS — S22081D Stable burst fracture of T11-T12 vertebra, subsequent encounter for fracture with routine healing: Secondary | ICD-10-CM | POA: Diagnosis not present

## 2018-12-10 DIAGNOSIS — M6281 Muscle weakness (generalized): Secondary | ICD-10-CM | POA: Diagnosis not present

## 2018-12-10 DIAGNOSIS — G309 Alzheimer's disease, unspecified: Secondary | ICD-10-CM | POA: Diagnosis not present

## 2018-12-10 DIAGNOSIS — U071 COVID-19: Secondary | ICD-10-CM | POA: Diagnosis not present

## 2018-12-10 DIAGNOSIS — F0280 Dementia in other diseases classified elsewhere without behavioral disturbance: Secondary | ICD-10-CM | POA: Diagnosis not present

## 2018-12-11 DIAGNOSIS — R2689 Other abnormalities of gait and mobility: Secondary | ICD-10-CM | POA: Diagnosis not present

## 2018-12-11 DIAGNOSIS — K5900 Constipation, unspecified: Secondary | ICD-10-CM | POA: Diagnosis not present

## 2018-12-11 DIAGNOSIS — E782 Mixed hyperlipidemia: Secondary | ICD-10-CM | POA: Diagnosis not present

## 2018-12-14 DIAGNOSIS — G309 Alzheimer's disease, unspecified: Secondary | ICD-10-CM | POA: Diagnosis not present

## 2018-12-14 DIAGNOSIS — S22081D Stable burst fracture of T11-T12 vertebra, subsequent encounter for fracture with routine healing: Secondary | ICD-10-CM | POA: Diagnosis not present

## 2018-12-14 DIAGNOSIS — U071 COVID-19: Secondary | ICD-10-CM | POA: Diagnosis not present

## 2018-12-14 DIAGNOSIS — M6281 Muscle weakness (generalized): Secondary | ICD-10-CM | POA: Diagnosis not present

## 2018-12-14 DIAGNOSIS — F0280 Dementia in other diseases classified elsewhere without behavioral disturbance: Secondary | ICD-10-CM | POA: Diagnosis not present

## 2018-12-14 DIAGNOSIS — N179 Acute kidney failure, unspecified: Secondary | ICD-10-CM | POA: Diagnosis not present

## 2018-12-16 DIAGNOSIS — F329 Major depressive disorder, single episode, unspecified: Secondary | ICD-10-CM | POA: Diagnosis not present

## 2018-12-16 DIAGNOSIS — K5901 Slow transit constipation: Secondary | ICD-10-CM | POA: Diagnosis not present

## 2018-12-16 DIAGNOSIS — M6281 Muscle weakness (generalized): Secondary | ICD-10-CM | POA: Diagnosis not present

## 2018-12-16 DIAGNOSIS — S22081D Stable burst fracture of T11-T12 vertebra, subsequent encounter for fracture with routine healing: Secondary | ICD-10-CM | POA: Diagnosis not present

## 2018-12-16 DIAGNOSIS — S51812A Laceration without foreign body of left forearm, initial encounter: Secondary | ICD-10-CM | POA: Diagnosis not present

## 2018-12-16 DIAGNOSIS — F0390 Unspecified dementia without behavioral disturbance: Secondary | ICD-10-CM | POA: Diagnosis not present

## 2018-12-16 DIAGNOSIS — G309 Alzheimer's disease, unspecified: Secondary | ICD-10-CM | POA: Diagnosis not present

## 2018-12-16 DIAGNOSIS — U071 COVID-19: Secondary | ICD-10-CM | POA: Diagnosis not present

## 2018-12-16 DIAGNOSIS — F0280 Dementia in other diseases classified elsewhere without behavioral disturbance: Secondary | ICD-10-CM | POA: Diagnosis not present

## 2018-12-16 DIAGNOSIS — N179 Acute kidney failure, unspecified: Secondary | ICD-10-CM | POA: Diagnosis not present

## 2018-12-16 DIAGNOSIS — R634 Abnormal weight loss: Secondary | ICD-10-CM | POA: Diagnosis not present

## 2018-12-21 ENCOUNTER — Emergency Department (HOSPITAL_COMMUNITY): Payer: Medicare Other

## 2018-12-21 ENCOUNTER — Other Ambulatory Visit: Payer: Self-pay

## 2018-12-21 ENCOUNTER — Emergency Department (HOSPITAL_COMMUNITY)
Admission: EM | Admit: 2018-12-21 | Discharge: 2018-12-22 | Disposition: A | Discharge status: Home / Self Care | Payer: Medicare Other | Attending: Emergency Medicine | Admitting: Emergency Medicine

## 2018-12-21 ENCOUNTER — Encounter (HOSPITAL_COMMUNITY): Payer: Self-pay | Admitting: Emergency Medicine

## 2018-12-21 DIAGNOSIS — W19XXXA Unspecified fall, initial encounter: Secondary | ICD-10-CM | POA: Insufficient documentation

## 2018-12-21 DIAGNOSIS — Z79899 Other long term (current) drug therapy: Secondary | ICD-10-CM | POA: Diagnosis not present

## 2018-12-21 DIAGNOSIS — Y939 Activity, unspecified: Secondary | ICD-10-CM | POA: Insufficient documentation

## 2018-12-21 DIAGNOSIS — N390 Urinary tract infection, site not specified: Secondary | ICD-10-CM | POA: Insufficient documentation

## 2018-12-21 DIAGNOSIS — S0081XA Abrasion of other part of head, initial encounter: Secondary | ICD-10-CM | POA: Diagnosis not present

## 2018-12-21 DIAGNOSIS — S0990XA Unspecified injury of head, initial encounter: Secondary | ICD-10-CM | POA: Diagnosis not present

## 2018-12-21 DIAGNOSIS — Y92013 Bedroom of single-family (private) house as the place of occurrence of the external cause: Secondary | ICD-10-CM | POA: Insufficient documentation

## 2018-12-21 DIAGNOSIS — E039 Hypothyroidism, unspecified: Secondary | ICD-10-CM | POA: Insufficient documentation

## 2018-12-21 DIAGNOSIS — F0390 Unspecified dementia without behavioral disturbance: Secondary | ICD-10-CM | POA: Diagnosis not present

## 2018-12-21 DIAGNOSIS — Y998 Other external cause status: Secondary | ICD-10-CM | POA: Diagnosis not present

## 2018-12-21 DIAGNOSIS — R51 Headache: Secondary | ICD-10-CM | POA: Diagnosis not present

## 2018-12-21 DIAGNOSIS — S14109A Unspecified injury at unspecified level of cervical spinal cord, initial encounter: Secondary | ICD-10-CM | POA: Diagnosis not present

## 2018-12-21 LAB — URINALYSIS, ROUTINE W REFLEX MICROSCOPIC
Bilirubin Urine: NEGATIVE
Glucose, UA: NEGATIVE mg/dL
Hgb urine dipstick: NEGATIVE
Ketones, ur: NEGATIVE mg/dL
Nitrite: POSITIVE — AB
Protein, ur: NEGATIVE mg/dL
Specific Gravity, Urine: 1.013 (ref 1.005–1.030)
pH: 7 (ref 5.0–8.0)

## 2018-12-21 MED ORDER — CEPHALEXIN 250 MG PO CAPS: 250.0000 mg | ORAL_CAPSULE | Freq: Four times a day (QID) | ORAL | 0 refills | Status: AC

## 2018-12-21 NOTE — ED Notes (Signed)
PTAR called at  this time. 

## 2018-12-21 NOTE — Discharge Instructions (Signed)
There were no serious injuries associated with the fall today.  You may have a urinary tract infection, which we are treating with an antibiotic prescription.  We did a urine culture which will help to guide further treatment if needed.

## 2018-12-21 NOTE — ED Notes (Signed)
Daughter called and updated at this time.
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
## 2018-12-22 DIAGNOSIS — F0281 Dementia in other diseases classified elsewhere with behavioral disturbance: Secondary | ICD-10-CM | POA: Diagnosis not present

## 2018-12-22 DIAGNOSIS — M6281 Muscle weakness (generalized): Secondary | ICD-10-CM | POA: Diagnosis not present

## 2018-12-22 DIAGNOSIS — N179 Acute kidney failure, unspecified: Secondary | ICD-10-CM | POA: Diagnosis not present

## 2018-12-22 DIAGNOSIS — G301 Alzheimer's disease with late onset: Secondary | ICD-10-CM | POA: Diagnosis not present

## 2018-12-22 DIAGNOSIS — M2550 Pain in unspecified joint: Secondary | ICD-10-CM | POA: Diagnosis not present

## 2018-12-22 DIAGNOSIS — G309 Alzheimer's disease, unspecified: Secondary | ICD-10-CM | POA: Diagnosis not present

## 2018-12-22 DIAGNOSIS — S22081D Stable burst fracture of T11-T12 vertebra, subsequent encounter for fracture with routine healing: Secondary | ICD-10-CM | POA: Diagnosis not present

## 2018-12-22 DIAGNOSIS — F064 Anxiety disorder due to known physiological condition: Secondary | ICD-10-CM | POA: Diagnosis not present

## 2018-12-22 DIAGNOSIS — F0280 Dementia in other diseases classified elsewhere without behavioral disturbance: Secondary | ICD-10-CM | POA: Diagnosis not present

## 2018-12-22 DIAGNOSIS — F331 Major depressive disorder, recurrent, moderate: Secondary | ICD-10-CM | POA: Diagnosis not present

## 2018-12-22 DIAGNOSIS — R5381 Other malaise: Secondary | ICD-10-CM | POA: Diagnosis not present

## 2018-12-22 DIAGNOSIS — U071 COVID-19: Secondary | ICD-10-CM | POA: Diagnosis not present

## 2018-12-22 DIAGNOSIS — Z7401 Bed confinement status: Secondary | ICD-10-CM | POA: Diagnosis not present

## 2018-12-23 DIAGNOSIS — F0390 Unspecified dementia without behavioral disturbance: Secondary | ICD-10-CM | POA: Diagnosis not present

## 2018-12-23 DIAGNOSIS — E039 Hypothyroidism, unspecified: Secondary | ICD-10-CM | POA: Diagnosis not present

## 2018-12-23 DIAGNOSIS — R2681 Unsteadiness on feet: Secondary | ICD-10-CM | POA: Diagnosis not present

## 2018-12-23 DIAGNOSIS — F329 Major depressive disorder, single episode, unspecified: Secondary | ICD-10-CM | POA: Diagnosis not present

## 2018-12-23 DIAGNOSIS — N3000 Acute cystitis without hematuria: Secondary | ICD-10-CM | POA: Diagnosis not present

## 2018-12-23 DIAGNOSIS — R634 Abnormal weight loss: Secondary | ICD-10-CM | POA: Diagnosis not present

## 2018-12-23 DIAGNOSIS — I10 Essential (primary) hypertension: Secondary | ICD-10-CM | POA: Diagnosis not present

## 2018-12-23 DIAGNOSIS — E119 Type 2 diabetes mellitus without complications: Secondary | ICD-10-CM | POA: Diagnosis not present

## 2018-12-23 DIAGNOSIS — E782 Mixed hyperlipidemia: Secondary | ICD-10-CM | POA: Diagnosis not present

## 2018-12-24 DIAGNOSIS — S22081D Stable burst fracture of T11-T12 vertebra, subsequent encounter for fracture with routine healing: Secondary | ICD-10-CM | POA: Diagnosis not present

## 2018-12-24 DIAGNOSIS — M6281 Muscle weakness (generalized): Secondary | ICD-10-CM | POA: Diagnosis not present

## 2018-12-24 DIAGNOSIS — N179 Acute kidney failure, unspecified: Secondary | ICD-10-CM | POA: Diagnosis not present

## 2018-12-24 DIAGNOSIS — G309 Alzheimer's disease, unspecified: Secondary | ICD-10-CM | POA: Diagnosis not present

## 2018-12-24 DIAGNOSIS — F0280 Dementia in other diseases classified elsewhere without behavioral disturbance: Secondary | ICD-10-CM | POA: Diagnosis not present

## 2018-12-24 DIAGNOSIS — U071 COVID-19: Secondary | ICD-10-CM | POA: Diagnosis not present

## 2018-12-24 LAB — URINE CULTURE: Culture: >=100000 — AB

## 2018-12-25 ENCOUNTER — Telehealth: Payer: Self-pay

## 2018-12-25 NOTE — Telephone Encounter (Signed)
Post ED Visit - Positive Culture Follow-up  Culture report reviewed by antimicrobial stewardship pharmacist: [place] Team []  Elenor Quinones, Pharm.D. []  Heide Guile, Pharm.D., BCPS AQ-ID []  Parks Neptune, Pharm.D., BCPS []  Alycia Rossetti, Pharm.D., BCPS []  [place], [place].D., BCPS, AAHIVP []  Legrand Como, Pharm.D., BCPS, AAHIVP []  Salome Arnt, PharmD, BCPS []  Johnnette Gourd, PharmD, BCPS []  Hughes Better, PharmD, BCPS []  Leeroy Cha, PharmD []  Laqueta Linden, PharmD, BCPS []  Albertina Parr, PharmD  [place] Team []  Leodis Sias, PharmD []  Lindell Spar, PharmD []  Royetta Asal, PharmD []  Graylin Shiver, Rph []  Rema Fendt) Glennon Mac, PharmD []  Arlyn Dunning, PharmD []  Netta Cedars, PharmD []  Dia Sitter, PharmD []  Leone Haven, PharmD []  Gretta Arab, PharmD []  Theodis Shove, PharmD []  Peggyann Juba, PharmD [x]  Reuel Boom, PharmD   Positive urine culture Treated with Cephalexin, organism sensitive to the same and no further patient follow-up is required at this time.  Genia Del 12/25/2018, 11:18 AM

## 2018-12-30 DIAGNOSIS — S22081D Stable burst fracture of T11-T12 vertebra, subsequent encounter for fracture with routine healing: Secondary | ICD-10-CM | POA: Diagnosis not present

## 2018-12-30 DIAGNOSIS — M6281 Muscle weakness (generalized): Secondary | ICD-10-CM | POA: Diagnosis not present

## 2018-12-30 DIAGNOSIS — F0280 Dementia in other diseases classified elsewhere without behavioral disturbance: Secondary | ICD-10-CM | POA: Diagnosis not present

## 2018-12-30 DIAGNOSIS — G309 Alzheimer's disease, unspecified: Secondary | ICD-10-CM | POA: Diagnosis not present

## 2018-12-30 DIAGNOSIS — U071 COVID-19: Secondary | ICD-10-CM | POA: Diagnosis not present

## 2018-12-30 DIAGNOSIS — N179 Acute kidney failure, unspecified: Secondary | ICD-10-CM | POA: Diagnosis not present

## 2019-01-02 DIAGNOSIS — I951 Orthostatic hypotension: Secondary | ICD-10-CM | POA: Diagnosis not present

## 2019-01-02 DIAGNOSIS — M6281 Muscle weakness (generalized): Secondary | ICD-10-CM | POA: Diagnosis not present

## 2019-01-02 DIAGNOSIS — F329 Major depressive disorder, single episode, unspecified: Secondary | ICD-10-CM | POA: Diagnosis not present

## 2019-01-02 DIAGNOSIS — E039 Hypothyroidism, unspecified: Secondary | ICD-10-CM | POA: Diagnosis not present

## 2019-01-02 DIAGNOSIS — F0280 Dementia in other diseases classified elsewhere without behavioral disturbance: Secondary | ICD-10-CM | POA: Diagnosis not present

## 2019-01-02 DIAGNOSIS — Z9181 History of falling: Secondary | ICD-10-CM | POA: Diagnosis not present

## 2019-01-02 DIAGNOSIS — M1990 Unspecified osteoarthritis, unspecified site: Secondary | ICD-10-CM | POA: Diagnosis not present

## 2019-01-02 DIAGNOSIS — R911 Solitary pulmonary nodule: Secondary | ICD-10-CM | POA: Diagnosis not present

## 2019-01-02 DIAGNOSIS — E785 Hyperlipidemia, unspecified: Secondary | ICD-10-CM | POA: Diagnosis not present

## 2019-01-02 DIAGNOSIS — G309 Alzheimer's disease, unspecified: Secondary | ICD-10-CM | POA: Diagnosis not present

## 2019-01-02 DIAGNOSIS — S22081D Stable burst fracture of T11-T12 vertebra, subsequent encounter for fracture with routine healing: Secondary | ICD-10-CM | POA: Diagnosis not present

## 2019-01-02 DIAGNOSIS — C679 Malignant neoplasm of bladder, unspecified: Secondary | ICD-10-CM | POA: Diagnosis not present

## 2019-01-05 DIAGNOSIS — F0280 Dementia in other diseases classified elsewhere without behavioral disturbance: Secondary | ICD-10-CM | POA: Diagnosis not present

## 2019-01-05 DIAGNOSIS — I951 Orthostatic hypotension: Secondary | ICD-10-CM | POA: Diagnosis not present

## 2019-01-05 DIAGNOSIS — E785 Hyperlipidemia, unspecified: Secondary | ICD-10-CM | POA: Diagnosis not present

## 2019-01-05 DIAGNOSIS — S22081D Stable burst fracture of T11-T12 vertebra, subsequent encounter for fracture with routine healing: Secondary | ICD-10-CM | POA: Diagnosis not present

## 2019-01-05 DIAGNOSIS — G309 Alzheimer's disease, unspecified: Secondary | ICD-10-CM | POA: Diagnosis not present

## 2019-01-05 DIAGNOSIS — M6281 Muscle weakness (generalized): Secondary | ICD-10-CM | POA: Diagnosis not present

## 2019-01-06 DIAGNOSIS — F329 Major depressive disorder, single episode, unspecified: Secondary | ICD-10-CM | POA: Diagnosis not present

## 2019-01-06 DIAGNOSIS — F0280 Dementia in other diseases classified elsewhere without behavioral disturbance: Secondary | ICD-10-CM | POA: Diagnosis not present

## 2019-01-06 DIAGNOSIS — E039 Hypothyroidism, unspecified: Secondary | ICD-10-CM | POA: Diagnosis not present

## 2019-01-06 DIAGNOSIS — E7849 Other hyperlipidemia: Secondary | ICD-10-CM | POA: Diagnosis not present

## 2019-01-07 DIAGNOSIS — F0280 Dementia in other diseases classified elsewhere without behavioral disturbance: Secondary | ICD-10-CM | POA: Diagnosis not present

## 2019-01-07 DIAGNOSIS — E785 Hyperlipidemia, unspecified: Secondary | ICD-10-CM | POA: Diagnosis not present

## 2019-01-07 DIAGNOSIS — S22081D Stable burst fracture of T11-T12 vertebra, subsequent encounter for fracture with routine healing: Secondary | ICD-10-CM | POA: Diagnosis not present

## 2019-01-07 DIAGNOSIS — I951 Orthostatic hypotension: Secondary | ICD-10-CM | POA: Diagnosis not present

## 2019-01-07 DIAGNOSIS — G309 Alzheimer's disease, unspecified: Secondary | ICD-10-CM | POA: Diagnosis not present

## 2019-01-07 DIAGNOSIS — M6281 Muscle weakness (generalized): Secondary | ICD-10-CM | POA: Diagnosis not present

## 2019-01-08 DIAGNOSIS — F09 Unspecified mental disorder due to known physiological condition: Secondary | ICD-10-CM | POA: Diagnosis not present

## 2019-01-08 DIAGNOSIS — M6281 Muscle weakness (generalized): Secondary | ICD-10-CM | POA: Diagnosis not present

## 2019-01-08 DIAGNOSIS — K5900 Constipation, unspecified: Secondary | ICD-10-CM | POA: Diagnosis not present

## 2019-01-11 DIAGNOSIS — I951 Orthostatic hypotension: Secondary | ICD-10-CM | POA: Diagnosis not present

## 2019-01-11 DIAGNOSIS — F0280 Dementia in other diseases classified elsewhere without behavioral disturbance: Secondary | ICD-10-CM | POA: Diagnosis not present

## 2019-01-11 DIAGNOSIS — M6281 Muscle weakness (generalized): Secondary | ICD-10-CM | POA: Diagnosis not present

## 2019-01-11 DIAGNOSIS — S22081D Stable burst fracture of T11-T12 vertebra, subsequent encounter for fracture with routine healing: Secondary | ICD-10-CM | POA: Diagnosis not present

## 2019-01-11 DIAGNOSIS — G309 Alzheimer's disease, unspecified: Secondary | ICD-10-CM | POA: Diagnosis not present

## 2019-01-11 DIAGNOSIS — E785 Hyperlipidemia, unspecified: Secondary | ICD-10-CM | POA: Diagnosis not present

## 2019-01-14 DIAGNOSIS — G309 Alzheimer's disease, unspecified: Secondary | ICD-10-CM | POA: Diagnosis not present

## 2019-01-14 DIAGNOSIS — I951 Orthostatic hypotension: Secondary | ICD-10-CM | POA: Diagnosis not present

## 2019-01-14 DIAGNOSIS — E785 Hyperlipidemia, unspecified: Secondary | ICD-10-CM | POA: Diagnosis not present

## 2019-01-14 DIAGNOSIS — F0280 Dementia in other diseases classified elsewhere without behavioral disturbance: Secondary | ICD-10-CM | POA: Diagnosis not present

## 2019-01-14 DIAGNOSIS — M6281 Muscle weakness (generalized): Secondary | ICD-10-CM | POA: Diagnosis not present

## 2019-01-14 DIAGNOSIS — S22081D Stable burst fracture of T11-T12 vertebra, subsequent encounter for fracture with routine healing: Secondary | ICD-10-CM | POA: Diagnosis not present

## 2019-01-19 DIAGNOSIS — I951 Orthostatic hypotension: Secondary | ICD-10-CM | POA: Diagnosis not present

## 2019-01-19 DIAGNOSIS — E782 Mixed hyperlipidemia: Secondary | ICD-10-CM | POA: Diagnosis not present

## 2019-01-19 DIAGNOSIS — F064 Anxiety disorder due to known physiological condition: Secondary | ICD-10-CM | POA: Diagnosis not present

## 2019-01-19 DIAGNOSIS — F0281 Dementia in other diseases classified elsewhere with behavioral disturbance: Secondary | ICD-10-CM | POA: Diagnosis not present

## 2019-01-19 DIAGNOSIS — F331 Major depressive disorder, recurrent, moderate: Secondary | ICD-10-CM | POA: Diagnosis not present

## 2019-01-19 DIAGNOSIS — G301 Alzheimer's disease with late onset: Secondary | ICD-10-CM | POA: Diagnosis not present

## 2019-01-19 DIAGNOSIS — E785 Hyperlipidemia, unspecified: Secondary | ICD-10-CM | POA: Diagnosis not present

## 2019-01-19 DIAGNOSIS — M6281 Muscle weakness (generalized): Secondary | ICD-10-CM | POA: Diagnosis not present

## 2019-01-19 DIAGNOSIS — F0280 Dementia in other diseases classified elsewhere without behavioral disturbance: Secondary | ICD-10-CM | POA: Diagnosis not present

## 2019-01-19 DIAGNOSIS — S22081D Stable burst fracture of T11-T12 vertebra, subsequent encounter for fracture with routine healing: Secondary | ICD-10-CM | POA: Diagnosis not present

## 2019-01-19 DIAGNOSIS — G309 Alzheimer's disease, unspecified: Secondary | ICD-10-CM | POA: Diagnosis not present

## 2019-01-21 DIAGNOSIS — F0280 Dementia in other diseases classified elsewhere without behavioral disturbance: Secondary | ICD-10-CM | POA: Diagnosis not present

## 2019-01-21 DIAGNOSIS — E785 Hyperlipidemia, unspecified: Secondary | ICD-10-CM | POA: Diagnosis not present

## 2019-01-21 DIAGNOSIS — G309 Alzheimer's disease, unspecified: Secondary | ICD-10-CM | POA: Diagnosis not present

## 2019-01-21 DIAGNOSIS — S22081D Stable burst fracture of T11-T12 vertebra, subsequent encounter for fracture with routine healing: Secondary | ICD-10-CM | POA: Diagnosis not present

## 2019-01-21 DIAGNOSIS — I951 Orthostatic hypotension: Secondary | ICD-10-CM | POA: Diagnosis not present

## 2019-01-21 DIAGNOSIS — B351 Tinea unguium: Secondary | ICD-10-CM | POA: Diagnosis not present

## 2019-01-21 DIAGNOSIS — M6281 Muscle weakness (generalized): Secondary | ICD-10-CM | POA: Diagnosis not present

## 2019-01-21 DIAGNOSIS — I739 Peripheral vascular disease, unspecified: Secondary | ICD-10-CM | POA: Diagnosis not present

## 2019-01-25 DIAGNOSIS — M6281 Muscle weakness (generalized): Secondary | ICD-10-CM | POA: Diagnosis not present

## 2019-01-25 DIAGNOSIS — S22081D Stable burst fracture of T11-T12 vertebra, subsequent encounter for fracture with routine healing: Secondary | ICD-10-CM | POA: Diagnosis not present

## 2019-01-25 DIAGNOSIS — F0280 Dementia in other diseases classified elsewhere without behavioral disturbance: Secondary | ICD-10-CM | POA: Diagnosis not present

## 2019-01-25 DIAGNOSIS — E785 Hyperlipidemia, unspecified: Secondary | ICD-10-CM | POA: Diagnosis not present

## 2019-01-25 DIAGNOSIS — G309 Alzheimer's disease, unspecified: Secondary | ICD-10-CM | POA: Diagnosis not present

## 2019-01-25 DIAGNOSIS — I951 Orthostatic hypotension: Secondary | ICD-10-CM | POA: Diagnosis not present

## 2019-01-28 DIAGNOSIS — F0280 Dementia in other diseases classified elsewhere without behavioral disturbance: Secondary | ICD-10-CM | POA: Diagnosis not present

## 2019-01-28 DIAGNOSIS — M6281 Muscle weakness (generalized): Secondary | ICD-10-CM | POA: Diagnosis not present

## 2019-01-28 DIAGNOSIS — S22081D Stable burst fracture of T11-T12 vertebra, subsequent encounter for fracture with routine healing: Secondary | ICD-10-CM | POA: Diagnosis not present

## 2019-01-28 DIAGNOSIS — I951 Orthostatic hypotension: Secondary | ICD-10-CM | POA: Diagnosis not present

## 2019-01-28 DIAGNOSIS — E785 Hyperlipidemia, unspecified: Secondary | ICD-10-CM | POA: Diagnosis not present

## 2019-01-28 DIAGNOSIS — G309 Alzheimer's disease, unspecified: Secondary | ICD-10-CM | POA: Diagnosis not present

## 2019-01-30 ENCOUNTER — Other Ambulatory Visit: Payer: Self-pay

## 2019-01-30 ENCOUNTER — Encounter (HOSPITAL_COMMUNITY): Payer: Self-pay | Admitting: *Deleted

## 2019-01-30 ENCOUNTER — Emergency Department (HOSPITAL_COMMUNITY)
Admission: EM | Admit: 2019-01-30 | Discharge: 2019-01-31 | Disposition: A | Discharge status: Home / Self Care | Payer: Medicare Other | Attending: Emergency Medicine | Admitting: Emergency Medicine

## 2019-01-30 DIAGNOSIS — R404 Transient alteration of awareness: Secondary | ICD-10-CM | POA: Diagnosis not present

## 2019-01-30 DIAGNOSIS — S0083XA Contusion of other part of head, initial encounter: Secondary | ICD-10-CM | POA: Insufficient documentation

## 2019-01-30 DIAGNOSIS — E039 Hypothyroidism, unspecified: Secondary | ICD-10-CM | POA: Diagnosis not present

## 2019-01-30 DIAGNOSIS — Y92129 Unspecified place in nursing home as the place of occurrence of the external cause: Secondary | ICD-10-CM | POA: Insufficient documentation

## 2019-01-30 DIAGNOSIS — I491 Atrial premature depolarization: Secondary | ICD-10-CM | POA: Diagnosis not present

## 2019-01-30 DIAGNOSIS — Y939 Activity, unspecified: Secondary | ICD-10-CM | POA: Diagnosis not present

## 2019-01-30 DIAGNOSIS — W19XXXA Unspecified fall, initial encounter: Secondary | ICD-10-CM | POA: Insufficient documentation

## 2019-01-30 DIAGNOSIS — Z79899 Other long term (current) drug therapy: Secondary | ICD-10-CM | POA: Diagnosis not present

## 2019-01-30 DIAGNOSIS — T148XXA Other injury of unspecified body region, initial encounter: Secondary | ICD-10-CM

## 2019-01-30 DIAGNOSIS — Y999 Unspecified external cause status: Secondary | ICD-10-CM | POA: Insufficient documentation

## 2019-01-30 DIAGNOSIS — S299XXA Unspecified injury of thorax, initial encounter: Secondary | ICD-10-CM | POA: Diagnosis not present

## 2019-01-30 DIAGNOSIS — S0990XA Unspecified injury of head, initial encounter: Secondary | ICD-10-CM | POA: Diagnosis present

## 2019-01-30 DIAGNOSIS — S3993XA Unspecified injury of pelvis, initial encounter: Secondary | ICD-10-CM | POA: Diagnosis not present

## 2019-01-30 DIAGNOSIS — S199XXA Unspecified injury of neck, initial encounter: Secondary | ICD-10-CM | POA: Diagnosis not present

## 2019-01-30 DIAGNOSIS — F0390 Unspecified dementia without behavioral disturbance: Secondary | ICD-10-CM | POA: Insufficient documentation

## 2019-01-30 DIAGNOSIS — R519 Headache, unspecified: Secondary | ICD-10-CM | POA: Diagnosis not present

## 2019-01-30 NOTE — ED Notes (Signed)
Pt daughter Leda Gauze was updated on pt status

## 2019-01-30 NOTE — ED Triage Notes (Signed)
Pt arrives via EMS from [facility] memory care unit facility. The pt had 2 unwitnessed falls today. EMS was not called after the first fall. Tonight, pt found on the floor. She has abrasion/hematoma over the right forehead. No hematoma. No pain with palpation to the neck or back area. Only c/o pain in her head. Hx of dementia, staff reports that the pt mental status is at baseline. VSS en route. IV established in the left [facility].

## 2019-01-31 ENCOUNTER — Emergency Department (HOSPITAL_COMMUNITY): Payer: Medicare Other

## 2019-01-31 DIAGNOSIS — S3993XA Unspecified injury of pelvis, initial encounter: Secondary | ICD-10-CM | POA: Diagnosis not present

## 2019-01-31 DIAGNOSIS — S299XXA Unspecified injury of thorax, initial encounter: Secondary | ICD-10-CM | POA: Diagnosis not present

## 2019-01-31 DIAGNOSIS — Z7401 Bed confinement status: Secondary | ICD-10-CM | POA: Diagnosis not present

## 2019-01-31 DIAGNOSIS — S0083XA Contusion of other part of head, initial encounter: Secondary | ICD-10-CM | POA: Diagnosis not present

## 2019-01-31 DIAGNOSIS — R519 Headache, unspecified: Secondary | ICD-10-CM | POA: Diagnosis not present

## 2019-01-31 DIAGNOSIS — R5381 Other malaise: Secondary | ICD-10-CM | POA: Diagnosis not present

## 2019-01-31 DIAGNOSIS — R52 Pain, unspecified: Secondary | ICD-10-CM | POA: Diagnosis not present

## 2019-01-31 DIAGNOSIS — M2550 Pain in unspecified joint: Secondary | ICD-10-CM | POA: Diagnosis not present

## 2019-01-31 DIAGNOSIS — S199XXA Unspecified injury of neck, initial encounter: Secondary | ICD-10-CM | POA: Diagnosis not present

## 2019-01-31 DIAGNOSIS — I959 Hypotension, unspecified: Secondary | ICD-10-CM | POA: Diagnosis not present

## 2019-01-31 LAB — CBC WITH DIFFERENTIAL/PLATELET
Abs Immature Granulocytes: 0.02 10*3/uL (ref 0.00–0.07)
Basophils Absolute: 0.1 10*3/uL (ref 0.0–0.1)
Basophils Relative: 1 %
Eosinophils Absolute: 0.2 10*3/uL (ref 0.0–0.5)
Eosinophils Relative: 2 %
HCT: 35.9 % — ABNORMAL LOW (ref 36.0–46.0)
Hemoglobin: 11.5 g/dL — ABNORMAL LOW (ref 12.0–15.0)
Immature Granulocytes: 0 %
Lymphocytes Relative: 19 %
Lymphs Abs: 1.4 10*3/uL (ref 0.7–4.0)
MCH: 32.5 pg (ref 26.0–34.0)
MCHC: 32 g/dL (ref 30.0–36.0)
MCV: 101.4 fL — ABNORMAL HIGH (ref 80.0–100.0)
Monocytes Absolute: 0.7 10*3/uL (ref 0.1–1.0)
Monocytes Relative: 9 %
Neutro Abs: 5.3 10*3/uL (ref 1.7–7.7)
Neutrophils Relative %: 69 %
Platelets: 255 10*3/uL (ref 150–400)
RBC: 3.54 MIL/uL — ABNORMAL LOW (ref 3.87–5.11)
RDW: 12.6 % (ref 11.5–15.5)
WBC: 7.6 10*3/uL (ref 4.0–10.5)
nRBC: 0 % (ref 0.0–0.2)

## 2019-01-31 LAB — BASIC METABOLIC PANEL
Anion gap: 8 (ref 5–15)
BUN: 20 mg/dL (ref 8–23)
CO2: 25 mmol/L (ref 22–32)
Calcium: 8.7 mg/dL — ABNORMAL LOW (ref 8.9–10.3)
Chloride: 106 mmol/L (ref 98–111)
Creatinine, Ser: 0.94 mg/dL (ref 0.44–1.00)
GFR calc Af Amer: >60 mL/min (ref >60)
GFR calc non Af Amer: 54 mL/min — ABNORMAL LOW (ref >60)
Glucose, Bld: 93 mg/dL (ref 70–99)
Potassium: 3.7 mmol/L (ref 3.5–5.1)
Sodium: 139 mmol/L (ref 135–145)

## 2019-01-31 NOTE — ED Notes (Signed)
Patient transported to CT 
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
## 2019-01-31 NOTE — ED Notes (Signed)
[facility] notified for transport 

## 2019-01-31 NOTE — Discharge Instructions (Addendum)
We saw you in the ER after you had a fall. °All the imaging results are normal, no fractures seen. No evidence of brain bleed. °Please be very careful with walking, and do everything possible to prevent falls. ° ° °

## 2019-01-31 NOTE — ED Notes (Signed)
Pt has pure wick in place.  

## 2019-02-01 DIAGNOSIS — F0280 Dementia in other diseases classified elsewhere without behavioral disturbance: Secondary | ICD-10-CM | POA: Diagnosis not present

## 2019-02-01 DIAGNOSIS — S22081D Stable burst fracture of T11-T12 vertebra, subsequent encounter for fracture with routine healing: Secondary | ICD-10-CM | POA: Diagnosis not present

## 2019-02-01 DIAGNOSIS — E039 Hypothyroidism, unspecified: Secondary | ICD-10-CM | POA: Diagnosis not present

## 2019-02-01 DIAGNOSIS — Z9181 History of falling: Secondary | ICD-10-CM | POA: Diagnosis not present

## 2019-02-01 DIAGNOSIS — M1990 Unspecified osteoarthritis, unspecified site: Secondary | ICD-10-CM | POA: Diagnosis not present

## 2019-02-01 DIAGNOSIS — C679 Malignant neoplasm of bladder, unspecified: Secondary | ICD-10-CM | POA: Diagnosis not present

## 2019-02-01 DIAGNOSIS — M6281 Muscle weakness (generalized): Secondary | ICD-10-CM | POA: Diagnosis not present

## 2019-02-01 DIAGNOSIS — G309 Alzheimer's disease, unspecified: Secondary | ICD-10-CM | POA: Diagnosis not present

## 2019-02-01 DIAGNOSIS — F329 Major depressive disorder, single episode, unspecified: Secondary | ICD-10-CM | POA: Diagnosis not present

## 2019-02-01 DIAGNOSIS — R911 Solitary pulmonary nodule: Secondary | ICD-10-CM | POA: Diagnosis not present

## 2019-02-01 DIAGNOSIS — I951 Orthostatic hypotension: Secondary | ICD-10-CM | POA: Diagnosis not present

## 2019-02-01 DIAGNOSIS — E785 Hyperlipidemia, unspecified: Secondary | ICD-10-CM | POA: Diagnosis not present

## 2019-02-02 DIAGNOSIS — F0281 Dementia in other diseases classified elsewhere with behavioral disturbance: Secondary | ICD-10-CM | POA: Diagnosis not present

## 2019-02-02 DIAGNOSIS — G301 Alzheimer's disease with late onset: Secondary | ICD-10-CM | POA: Diagnosis not present

## 2019-02-02 DIAGNOSIS — E782 Mixed hyperlipidemia: Secondary | ICD-10-CM | POA: Diagnosis not present

## 2019-02-02 DIAGNOSIS — F331 Major depressive disorder, recurrent, moderate: Secondary | ICD-10-CM | POA: Diagnosis not present

## 2019-02-02 DIAGNOSIS — F064 Anxiety disorder due to known physiological condition: Secondary | ICD-10-CM | POA: Diagnosis not present

## 2019-02-02 DIAGNOSIS — M6281 Muscle weakness (generalized): Secondary | ICD-10-CM | POA: Diagnosis not present

## 2019-02-02 DIAGNOSIS — R2689 Other abnormalities of gait and mobility: Secondary | ICD-10-CM | POA: Diagnosis not present

## 2019-02-03 DIAGNOSIS — R634 Abnormal weight loss: Secondary | ICD-10-CM | POA: Diagnosis not present

## 2019-02-03 DIAGNOSIS — E785 Hyperlipidemia, unspecified: Secondary | ICD-10-CM | POA: Diagnosis not present

## 2019-02-03 DIAGNOSIS — G309 Alzheimer's disease, unspecified: Secondary | ICD-10-CM | POA: Diagnosis not present

## 2019-02-03 DIAGNOSIS — F0280 Dementia in other diseases classified elsewhere without behavioral disturbance: Secondary | ICD-10-CM | POA: Diagnosis not present

## 2019-02-03 DIAGNOSIS — F329 Major depressive disorder, single episode, unspecified: Secondary | ICD-10-CM | POA: Diagnosis not present

## 2019-02-03 DIAGNOSIS — I951 Orthostatic hypotension: Secondary | ICD-10-CM | POA: Diagnosis not present

## 2019-02-03 DIAGNOSIS — R300 Dysuria: Secondary | ICD-10-CM | POA: Diagnosis not present

## 2019-02-03 DIAGNOSIS — M6281 Muscle weakness (generalized): Secondary | ICD-10-CM | POA: Diagnosis not present

## 2019-02-03 DIAGNOSIS — S22081D Stable burst fracture of T11-T12 vertebra, subsequent encounter for fracture with routine healing: Secondary | ICD-10-CM | POA: Diagnosis not present

## 2019-02-03 DIAGNOSIS — R2681 Unsteadiness on feet: Secondary | ICD-10-CM | POA: Diagnosis not present

## 2019-02-03 DIAGNOSIS — F0390 Unspecified dementia without behavioral disturbance: Secondary | ICD-10-CM | POA: Diagnosis not present

## 2019-02-05 DIAGNOSIS — E782 Mixed hyperlipidemia: Secondary | ICD-10-CM | POA: Diagnosis not present

## 2019-02-05 DIAGNOSIS — M6281 Muscle weakness (generalized): Secondary | ICD-10-CM | POA: Diagnosis not present

## 2019-02-05 DIAGNOSIS — R2689 Other abnormalities of gait and mobility: Secondary | ICD-10-CM | POA: Diagnosis not present

## 2019-02-08 DIAGNOSIS — S22081D Stable burst fracture of T11-T12 vertebra, subsequent encounter for fracture with routine healing: Secondary | ICD-10-CM | POA: Diagnosis not present

## 2019-02-08 DIAGNOSIS — M6281 Muscle weakness (generalized): Secondary | ICD-10-CM | POA: Diagnosis not present

## 2019-02-08 DIAGNOSIS — F0280 Dementia in other diseases classified elsewhere without behavioral disturbance: Secondary | ICD-10-CM | POA: Diagnosis not present

## 2019-02-08 DIAGNOSIS — I951 Orthostatic hypotension: Secondary | ICD-10-CM | POA: Diagnosis not present

## 2019-02-08 DIAGNOSIS — E785 Hyperlipidemia, unspecified: Secondary | ICD-10-CM | POA: Diagnosis not present

## 2019-02-08 DIAGNOSIS — G309 Alzheimer's disease, unspecified: Secondary | ICD-10-CM | POA: Diagnosis not present

## 2019-02-11 DIAGNOSIS — S22081D Stable burst fracture of T11-T12 vertebra, subsequent encounter for fracture with routine healing: Secondary | ICD-10-CM | POA: Diagnosis not present

## 2019-02-11 DIAGNOSIS — G309 Alzheimer's disease, unspecified: Secondary | ICD-10-CM | POA: Diagnosis not present

## 2019-02-11 DIAGNOSIS — F0280 Dementia in other diseases classified elsewhere without behavioral disturbance: Secondary | ICD-10-CM | POA: Diagnosis not present

## 2019-02-11 DIAGNOSIS — E785 Hyperlipidemia, unspecified: Secondary | ICD-10-CM | POA: Diagnosis not present

## 2019-02-11 DIAGNOSIS — M6281 Muscle weakness (generalized): Secondary | ICD-10-CM | POA: Diagnosis not present

## 2019-02-11 DIAGNOSIS — I951 Orthostatic hypotension: Secondary | ICD-10-CM | POA: Diagnosis not present

## 2019-02-15 DIAGNOSIS — F0280 Dementia in other diseases classified elsewhere without behavioral disturbance: Secondary | ICD-10-CM | POA: Diagnosis not present

## 2019-02-15 DIAGNOSIS — E782 Mixed hyperlipidemia: Secondary | ICD-10-CM | POA: Diagnosis not present

## 2019-02-15 DIAGNOSIS — E785 Hyperlipidemia, unspecified: Secondary | ICD-10-CM | POA: Diagnosis not present

## 2019-02-15 DIAGNOSIS — G301 Alzheimer's disease with late onset: Secondary | ICD-10-CM | POA: Diagnosis not present

## 2019-02-15 DIAGNOSIS — M6281 Muscle weakness (generalized): Secondary | ICD-10-CM | POA: Diagnosis not present

## 2019-02-15 DIAGNOSIS — G309 Alzheimer's disease, unspecified: Secondary | ICD-10-CM | POA: Diagnosis not present

## 2019-02-15 DIAGNOSIS — I951 Orthostatic hypotension: Secondary | ICD-10-CM | POA: Diagnosis not present

## 2019-02-15 DIAGNOSIS — S22081D Stable burst fracture of T11-T12 vertebra, subsequent encounter for fracture with routine healing: Secondary | ICD-10-CM | POA: Diagnosis not present

## 2019-02-16 DIAGNOSIS — F0281 Dementia in other diseases classified elsewhere with behavioral disturbance: Secondary | ICD-10-CM | POA: Diagnosis not present

## 2019-02-16 DIAGNOSIS — F064 Anxiety disorder due to known physiological condition: Secondary | ICD-10-CM | POA: Diagnosis not present

## 2019-02-16 DIAGNOSIS — G301 Alzheimer's disease with late onset: Secondary | ICD-10-CM | POA: Diagnosis not present

## 2019-02-16 DIAGNOSIS — F331 Major depressive disorder, recurrent, moderate: Secondary | ICD-10-CM | POA: Diagnosis not present

## 2019-02-17 DIAGNOSIS — R2681 Unsteadiness on feet: Secondary | ICD-10-CM | POA: Diagnosis not present

## 2019-02-17 DIAGNOSIS — R300 Dysuria: Secondary | ICD-10-CM | POA: Diagnosis not present

## 2019-02-17 DIAGNOSIS — F329 Major depressive disorder, single episode, unspecified: Secondary | ICD-10-CM | POA: Diagnosis not present

## 2019-02-17 DIAGNOSIS — R634 Abnormal weight loss: Secondary | ICD-10-CM | POA: Diagnosis not present

## 2019-02-17 DIAGNOSIS — F0390 Unspecified dementia without behavioral disturbance: Secondary | ICD-10-CM | POA: Diagnosis not present

## 2019-02-24 DIAGNOSIS — F0390 Unspecified dementia without behavioral disturbance: Secondary | ICD-10-CM | POA: Diagnosis not present

## 2019-02-24 DIAGNOSIS — W19XXXA Unspecified fall, initial encounter: Secondary | ICD-10-CM | POA: Diagnosis not present

## 2019-02-24 DIAGNOSIS — F329 Major depressive disorder, single episode, unspecified: Secondary | ICD-10-CM | POA: Diagnosis not present

## 2019-02-24 DIAGNOSIS — R2681 Unsteadiness on feet: Secondary | ICD-10-CM | POA: Diagnosis not present

## 2019-02-24 DIAGNOSIS — R634 Abnormal weight loss: Secondary | ICD-10-CM | POA: Diagnosis not present

## 2019-03-02 DIAGNOSIS — E785 Hyperlipidemia, unspecified: Secondary | ICD-10-CM | POA: Diagnosis not present

## 2019-03-02 DIAGNOSIS — S22081D Stable burst fracture of T11-T12 vertebra, subsequent encounter for fracture with routine healing: Secondary | ICD-10-CM | POA: Diagnosis not present

## 2019-03-02 DIAGNOSIS — G309 Alzheimer's disease, unspecified: Secondary | ICD-10-CM | POA: Diagnosis not present

## 2019-03-02 DIAGNOSIS — M6281 Muscle weakness (generalized): Secondary | ICD-10-CM | POA: Diagnosis not present

## 2019-03-02 DIAGNOSIS — F0280 Dementia in other diseases classified elsewhere without behavioral disturbance: Secondary | ICD-10-CM | POA: Diagnosis not present

## 2019-03-02 DIAGNOSIS — I951 Orthostatic hypotension: Secondary | ICD-10-CM | POA: Diagnosis not present

## 2019-03-03 DIAGNOSIS — I951 Orthostatic hypotension: Secondary | ICD-10-CM | POA: Diagnosis not present

## 2019-03-03 DIAGNOSIS — M1990 Unspecified osteoarthritis, unspecified site: Secondary | ICD-10-CM | POA: Diagnosis not present

## 2019-03-03 DIAGNOSIS — C679 Malignant neoplasm of bladder, unspecified: Secondary | ICD-10-CM | POA: Diagnosis not present

## 2019-03-03 DIAGNOSIS — E785 Hyperlipidemia, unspecified: Secondary | ICD-10-CM | POA: Diagnosis not present

## 2019-03-03 DIAGNOSIS — R911 Solitary pulmonary nodule: Secondary | ICD-10-CM | POA: Diagnosis not present

## 2019-03-03 DIAGNOSIS — G309 Alzheimer's disease, unspecified: Secondary | ICD-10-CM | POA: Diagnosis not present

## 2019-03-03 DIAGNOSIS — F329 Major depressive disorder, single episode, unspecified: Secondary | ICD-10-CM | POA: Diagnosis not present

## 2019-03-03 DIAGNOSIS — M6281 Muscle weakness (generalized): Secondary | ICD-10-CM | POA: Diagnosis not present

## 2019-03-03 DIAGNOSIS — E039 Hypothyroidism, unspecified: Secondary | ICD-10-CM | POA: Diagnosis not present

## 2019-03-03 DIAGNOSIS — S22081D Stable burst fracture of T11-T12 vertebra, subsequent encounter for fracture with routine healing: Secondary | ICD-10-CM | POA: Diagnosis not present

## 2019-03-03 DIAGNOSIS — F0280 Dementia in other diseases classified elsewhere without behavioral disturbance: Secondary | ICD-10-CM | POA: Diagnosis not present

## 2019-03-03 DIAGNOSIS — Z9181 History of falling: Secondary | ICD-10-CM | POA: Diagnosis not present

## 2019-03-09 DIAGNOSIS — E785 Hyperlipidemia, unspecified: Secondary | ICD-10-CM | POA: Diagnosis not present

## 2019-03-09 DIAGNOSIS — I951 Orthostatic hypotension: Secondary | ICD-10-CM | POA: Diagnosis not present

## 2019-03-09 DIAGNOSIS — F0280 Dementia in other diseases classified elsewhere without behavioral disturbance: Secondary | ICD-10-CM | POA: Diagnosis not present

## 2019-03-09 DIAGNOSIS — S22081D Stable burst fracture of T11-T12 vertebra, subsequent encounter for fracture with routine healing: Secondary | ICD-10-CM | POA: Diagnosis not present

## 2019-03-09 DIAGNOSIS — G309 Alzheimer's disease, unspecified: Secondary | ICD-10-CM | POA: Diagnosis not present

## 2019-03-09 DIAGNOSIS — M6281 Muscle weakness (generalized): Secondary | ICD-10-CM | POA: Diagnosis not present

## 2019-03-16 DIAGNOSIS — F0281 Dementia in other diseases classified elsewhere with behavioral disturbance: Secondary | ICD-10-CM | POA: Diagnosis not present

## 2019-03-16 DIAGNOSIS — F064 Anxiety disorder due to known physiological condition: Secondary | ICD-10-CM | POA: Diagnosis not present

## 2019-03-16 DIAGNOSIS — G301 Alzheimer's disease with late onset: Secondary | ICD-10-CM | POA: Diagnosis not present

## 2019-03-16 DIAGNOSIS — F331 Major depressive disorder, recurrent, moderate: Secondary | ICD-10-CM | POA: Diagnosis not present

## 2019-03-17 DIAGNOSIS — W19XXXA Unspecified fall, initial encounter: Secondary | ICD-10-CM | POA: Diagnosis not present

## 2019-03-17 DIAGNOSIS — R2681 Unsteadiness on feet: Secondary | ICD-10-CM | POA: Diagnosis not present

## 2019-03-17 DIAGNOSIS — R630 Anorexia: Secondary | ICD-10-CM | POA: Diagnosis not present

## 2019-03-17 DIAGNOSIS — Z9181 History of falling: Secondary | ICD-10-CM | POA: Diagnosis not present

## 2019-03-24 DIAGNOSIS — F0390 Unspecified dementia without behavioral disturbance: Secondary | ICD-10-CM | POA: Diagnosis not present

## 2019-03-24 DIAGNOSIS — S51011A Laceration without foreign body of right elbow, initial encounter: Secondary | ICD-10-CM | POA: Diagnosis not present

## 2019-03-24 DIAGNOSIS — R2681 Unsteadiness on feet: Secondary | ICD-10-CM | POA: Diagnosis not present

## 2019-03-24 DIAGNOSIS — W19XXXA Unspecified fall, initial encounter: Secondary | ICD-10-CM | POA: Diagnosis not present

## 2019-03-24 DIAGNOSIS — K5901 Slow transit constipation: Secondary | ICD-10-CM | POA: Diagnosis not present

## 2019-03-26 DIAGNOSIS — F064 Anxiety disorder due to known physiological condition: Secondary | ICD-10-CM | POA: Diagnosis not present

## 2019-03-26 DIAGNOSIS — F0281 Dementia in other diseases classified elsewhere with behavioral disturbance: Secondary | ICD-10-CM | POA: Diagnosis not present

## 2019-03-26 DIAGNOSIS — G301 Alzheimer's disease with late onset: Secondary | ICD-10-CM | POA: Diagnosis not present

## 2019-03-28 DIAGNOSIS — Z23 Encounter for immunization: Secondary | ICD-10-CM | POA: Diagnosis not present

## 2019-03-29 DIAGNOSIS — F419 Anxiety disorder, unspecified: Secondary | ICD-10-CM | POA: Diagnosis not present

## 2019-03-29 DIAGNOSIS — G301 Alzheimer's disease with late onset: Secondary | ICD-10-CM | POA: Diagnosis not present

## 2019-03-30 DIAGNOSIS — F0281 Dementia in other diseases classified elsewhere with behavioral disturbance: Secondary | ICD-10-CM | POA: Diagnosis not present

## 2019-03-30 DIAGNOSIS — G301 Alzheimer's disease with late onset: Secondary | ICD-10-CM | POA: Diagnosis not present

## 2019-03-30 DIAGNOSIS — F331 Major depressive disorder, recurrent, moderate: Secondary | ICD-10-CM | POA: Diagnosis not present

## 2019-03-30 DIAGNOSIS — F064 Anxiety disorder due to known physiological condition: Secondary | ICD-10-CM | POA: Diagnosis not present

## 2019-03-31 DIAGNOSIS — R2681 Unsteadiness on feet: Secondary | ICD-10-CM | POA: Diagnosis not present

## 2019-03-31 DIAGNOSIS — F0390 Unspecified dementia without behavioral disturbance: Secondary | ICD-10-CM | POA: Diagnosis not present

## 2019-03-31 DIAGNOSIS — W19XXXA Unspecified fall, initial encounter: Secondary | ICD-10-CM | POA: Diagnosis not present

## 2019-04-02 DIAGNOSIS — E039 Hypothyroidism, unspecified: Secondary | ICD-10-CM | POA: Diagnosis not present

## 2019-04-02 DIAGNOSIS — Z9181 History of falling: Secondary | ICD-10-CM | POA: Diagnosis not present

## 2019-04-02 DIAGNOSIS — M6281 Muscle weakness (generalized): Secondary | ICD-10-CM | POA: Diagnosis not present

## 2019-04-02 DIAGNOSIS — G309 Alzheimer's disease, unspecified: Secondary | ICD-10-CM | POA: Diagnosis not present

## 2019-04-02 DIAGNOSIS — R911 Solitary pulmonary nodule: Secondary | ICD-10-CM | POA: Diagnosis not present

## 2019-04-02 DIAGNOSIS — S22081D Stable burst fracture of T11-T12 vertebra, subsequent encounter for fracture with routine healing: Secondary | ICD-10-CM | POA: Diagnosis not present

## 2019-04-02 DIAGNOSIS — E785 Hyperlipidemia, unspecified: Secondary | ICD-10-CM | POA: Diagnosis not present

## 2019-04-02 DIAGNOSIS — I951 Orthostatic hypotension: Secondary | ICD-10-CM | POA: Diagnosis not present

## 2019-04-02 DIAGNOSIS — M1990 Unspecified osteoarthritis, unspecified site: Secondary | ICD-10-CM | POA: Diagnosis not present

## 2019-04-02 DIAGNOSIS — F0280 Dementia in other diseases classified elsewhere without behavioral disturbance: Secondary | ICD-10-CM | POA: Diagnosis not present

## 2019-04-02 DIAGNOSIS — C679 Malignant neoplasm of bladder, unspecified: Secondary | ICD-10-CM | POA: Diagnosis not present

## 2019-04-02 DIAGNOSIS — F329 Major depressive disorder, single episode, unspecified: Secondary | ICD-10-CM | POA: Diagnosis not present

## 2019-04-05 DIAGNOSIS — G309 Alzheimer's disease, unspecified: Secondary | ICD-10-CM | POA: Diagnosis not present

## 2019-04-05 DIAGNOSIS — M6281 Muscle weakness (generalized): Secondary | ICD-10-CM | POA: Diagnosis not present

## 2019-04-05 DIAGNOSIS — F0280 Dementia in other diseases classified elsewhere without behavioral disturbance: Secondary | ICD-10-CM | POA: Diagnosis not present

## 2019-04-05 DIAGNOSIS — I951 Orthostatic hypotension: Secondary | ICD-10-CM | POA: Diagnosis not present

## 2019-04-05 DIAGNOSIS — S22081D Stable burst fracture of T11-T12 vertebra, subsequent encounter for fracture with routine healing: Secondary | ICD-10-CM | POA: Diagnosis not present

## 2019-04-05 DIAGNOSIS — E785 Hyperlipidemia, unspecified: Secondary | ICD-10-CM | POA: Diagnosis not present

## 2019-04-08 DIAGNOSIS — R630 Anorexia: Secondary | ICD-10-CM | POA: Diagnosis not present

## 2019-04-08 DIAGNOSIS — B351 Tinea unguium: Secondary | ICD-10-CM | POA: Diagnosis not present

## 2019-04-08 DIAGNOSIS — I739 Peripheral vascular disease, unspecified: Secondary | ICD-10-CM | POA: Diagnosis not present

## 2019-04-13 DIAGNOSIS — G301 Alzheimer's disease with late onset: Secondary | ICD-10-CM | POA: Diagnosis not present

## 2019-04-13 DIAGNOSIS — F331 Major depressive disorder, recurrent, moderate: Secondary | ICD-10-CM | POA: Diagnosis not present

## 2019-04-13 DIAGNOSIS — F064 Anxiety disorder due to known physiological condition: Secondary | ICD-10-CM | POA: Diagnosis not present

## 2019-04-13 DIAGNOSIS — F0281 Dementia in other diseases classified elsewhere with behavioral disturbance: Secondary | ICD-10-CM | POA: Diagnosis not present
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
## 2020-02-28 ENCOUNTER — Other Ambulatory Visit (HOSPITAL_BASED_OUTPATIENT_CLINIC_OR_DEPARTMENT_OTHER): Payer: Self-pay | Admitting: Nurse Practitioner

## 2020-02-28 DIAGNOSIS — R911 Solitary pulmonary nodule: Secondary | ICD-10-CM

## 2020-03-07 ENCOUNTER — Ambulatory Visit (HOSPITAL_BASED_OUTPATIENT_CLINIC_OR_DEPARTMENT_OTHER): Payer: Medicare Other

## 2020-03-13 ENCOUNTER — Ambulatory Visit (HOSPITAL_BASED_OUTPATIENT_CLINIC_OR_DEPARTMENT_OTHER)
Admission: RE | Admit: 2020-03-13 | Discharge: 2020-03-13 | Disposition: A | Discharge status: Home / Self Care | Payer: Medicare Other | Source: Ambulatory Visit | Attending: Nurse Practitioner | Admitting: Nurse Practitioner

## 2020-03-13 ENCOUNTER — Other Ambulatory Visit: Payer: Self-pay

## 2020-03-13 DIAGNOSIS — R911 Solitary pulmonary nodule: Secondary | ICD-10-CM | POA: Insufficient documentation

## 2020-03-13 MED ORDER — IOHEXOL 300 MG/ML  SOLN
100.0000 mL | Freq: Once | INTRAMUSCULAR | Status: AC | PRN
  Administered 2020-03-13: 80 mL via INTRAVENOUS

## 2020-04-09 ENCOUNTER — Emergency Department (HOSPITAL_COMMUNITY): Payer: Medicare Other

## 2020-04-09 ENCOUNTER — Encounter (HOSPITAL_COMMUNITY): Payer: Self-pay | Admitting: Emergency Medicine

## 2020-04-09 ENCOUNTER — Emergency Department (HOSPITAL_COMMUNITY)
Admission: EM | Admit: 2020-04-09 | Discharge: 2020-04-09 | Disposition: A | Discharge status: Home / Self Care | Payer: Medicare Other | Attending: Emergency Medicine | Admitting: Emergency Medicine

## 2020-04-09 ENCOUNTER — Other Ambulatory Visit: Payer: Self-pay

## 2020-04-09 DIAGNOSIS — F0390 Unspecified dementia without behavioral disturbance: Secondary | ICD-10-CM | POA: Insufficient documentation

## 2020-04-09 DIAGNOSIS — S0990XA Unspecified injury of head, initial encounter: Secondary | ICD-10-CM | POA: Diagnosis present

## 2020-04-09 DIAGNOSIS — Z79899 Other long term (current) drug therapy: Secondary | ICD-10-CM | POA: Diagnosis not present

## 2020-04-09 DIAGNOSIS — S0083XA Contusion of other part of head, initial encounter: Secondary | ICD-10-CM | POA: Insufficient documentation

## 2020-04-09 DIAGNOSIS — W19XXXA Unspecified fall, initial encounter: Secondary | ICD-10-CM

## 2020-04-09 DIAGNOSIS — E039 Hypothyroidism, unspecified: Secondary | ICD-10-CM | POA: Insufficient documentation

## 2020-04-09 DIAGNOSIS — W01198A Fall on same level from slipping, tripping and stumbling with subsequent striking against other object, initial encounter: Secondary | ICD-10-CM | POA: Insufficient documentation

## 2020-04-09 LAB — COMPREHENSIVE METABOLIC PANEL
ALT: 9 U/L (ref 0–44)
AST: 17 U/L (ref 15–41)
Albumin: 3.6 g/dL (ref 3.5–5.0)
Alkaline Phosphatase: 54 U/L (ref 38–126)
Anion gap: 9 (ref 5–15)
BUN: 20 mg/dL (ref 8–23)
CO2: 25 mmol/L (ref 22–32)
Calcium: 8.8 mg/dL — ABNORMAL LOW (ref 8.9–10.3)
Chloride: 105 mmol/L (ref 98–111)
Creatinine, Ser: 1.01 mg/dL — ABNORMAL HIGH (ref 0.44–1.00)
GFR, Estimated: 53 mL/min — ABNORMAL LOW (ref >60)
Glucose, Bld: 91 mg/dL (ref 70–99)
Potassium: 4.1 mmol/L (ref 3.5–5.1)
Sodium: 139 mmol/L (ref 135–145)
Total Bilirubin: 0.6 mg/dL (ref 0.3–1.2)
Total Protein: 6.7 g/dL (ref 6.5–8.1)

## 2020-04-09 LAB — URINALYSIS, ROUTINE W REFLEX MICROSCOPIC
Bilirubin Urine: NEGATIVE
Glucose, UA: NEGATIVE mg/dL
Hgb urine dipstick: NEGATIVE
Ketones, ur: NEGATIVE mg/dL
Leukocytes,Ua: NEGATIVE
Nitrite: NEGATIVE
Protein, ur: NEGATIVE mg/dL
Specific Gravity, Urine: 1.011 (ref 1.005–1.030)
pH: 7 (ref 5.0–8.0)

## 2020-04-09 LAB — CBC WITH DIFFERENTIAL/PLATELET
Abs Immature Granulocytes: 0.03 10*3/uL (ref 0.00–0.07)
Basophils Absolute: 0.1 10*3/uL (ref 0.0–0.1)
Basophils Relative: 1 %
Eosinophils Absolute: 0.2 10*3/uL (ref 0.0–0.5)
Eosinophils Relative: 3 %
HCT: 39 % (ref 36.0–46.0)
Hemoglobin: 12.2 g/dL (ref 12.0–15.0)
Immature Granulocytes: 0 %
Lymphocytes Relative: 19 %
Lymphs Abs: 1.3 10*3/uL (ref 0.7–4.0)
MCH: 30.7 pg (ref 26.0–34.0)
MCHC: 31.3 g/dL (ref 30.0–36.0)
MCV: 98.2 fL (ref 80.0–100.0)
Monocytes Absolute: 0.5 10*3/uL (ref 0.1–1.0)
Monocytes Relative: 8 %
Neutro Abs: 4.8 10*3/uL (ref 1.7–7.7)
Neutrophils Relative %: 69 %
Platelets: 251 10*3/uL (ref 150–400)
RBC: 3.97 MIL/uL (ref 3.87–5.11)
RDW: 12.5 % (ref 11.5–15.5)
WBC: 6.9 10*3/uL (ref 4.0–10.5)
nRBC: 0 % (ref 0.0–0.2)

## 2020-04-09 NOTE — ED Triage Notes (Signed)
Per [facility] pt from [place] in  [employer] [place] for a fall on way to bathroom. Has bruising to face.  130/64, 64HR, 14R, 99% on RA.

## 2020-04-09 NOTE — ED Notes (Signed)
Patient transported to CT 

## 2020-04-09 NOTE — Discharge Instructions (Signed)
Your work-up today was overall reassuring.  You do not have any injuries from the fall.  Please take Tylenol as needed for pain.  Return to the ER for any new or worsening symptoms.

## 2020-04-09 NOTE — ED Notes (Signed)
[facility] called for transport.  
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
## 2020-04-09 NOTE — ED Notes (Signed)
Patient placed on purewick to obtain urine sample 
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
## 2020-12-23 IMAGING — CT CT CERVICAL SPINE W/O CM
3 of 4 series · 11 of 33 positions shown, 13 images · non-contrast
Comparison: CT 12/21/2018

CLINICAL DATA: Unwitnessed fall

EXAM:
CT CERVICAL SPINE WITHOUT CONTRAST
TECHNIQUE: Multidetector CT imaging of the cervical spine was performed without
intravenous contrast. Multiplanar CT image reconstructions were also
generated.

[Series 6: orthogonal bone · axial · 0.22mm/px · z∈[-288,-169]mm · 3 of 92 slices shown, 4 images]
[im 16/92  soft-tissue]
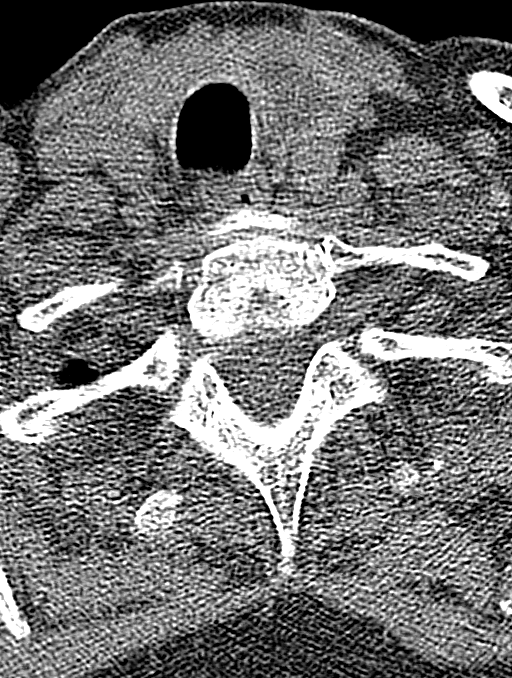
[im 16/92  bone]
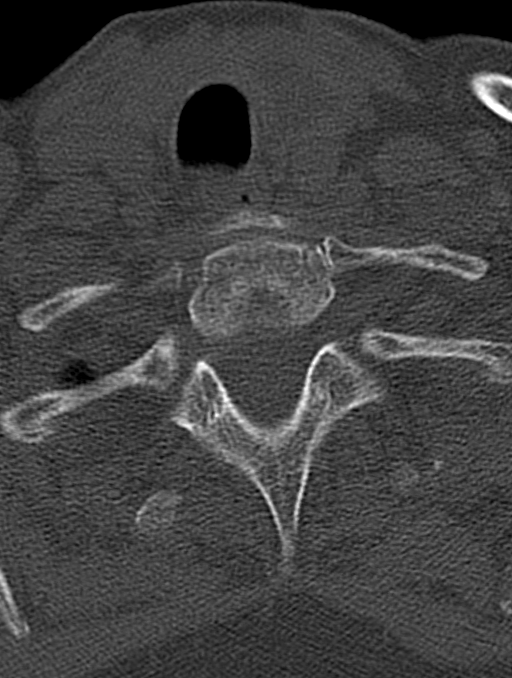
[im 46/92  bone]
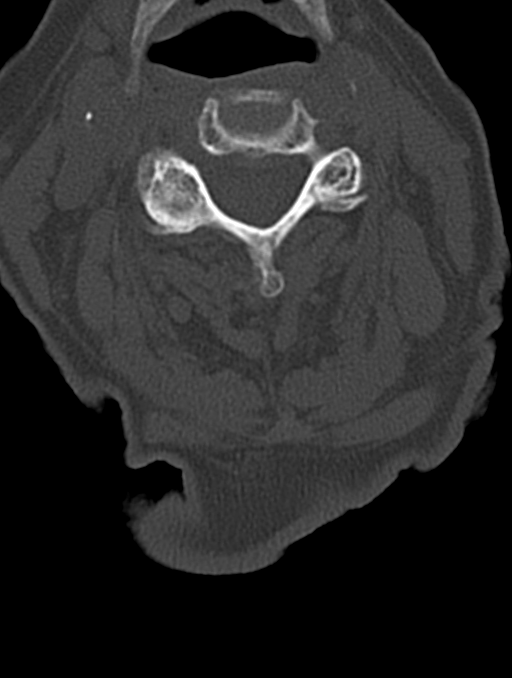
[im 76/92  bone]
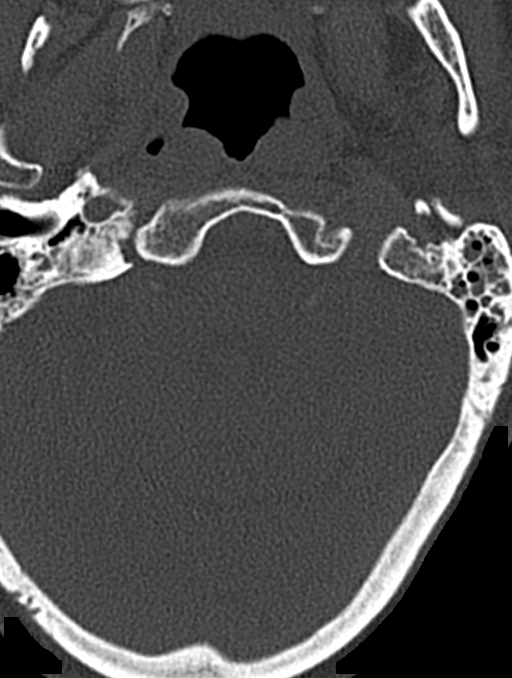

[Series 7: coronal bone · coronal · 0.24mm/px · 3 of 67 slices shown]
[im 14/67  bone]
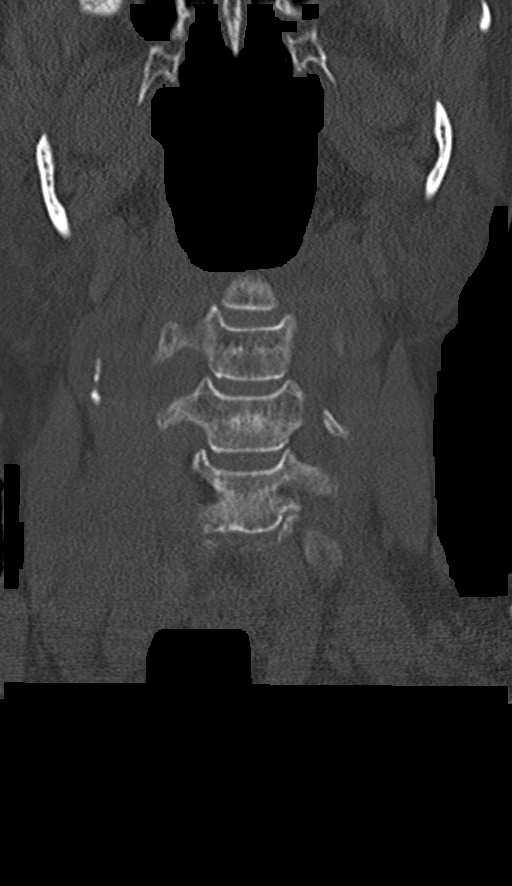
[im 27/67  bone]
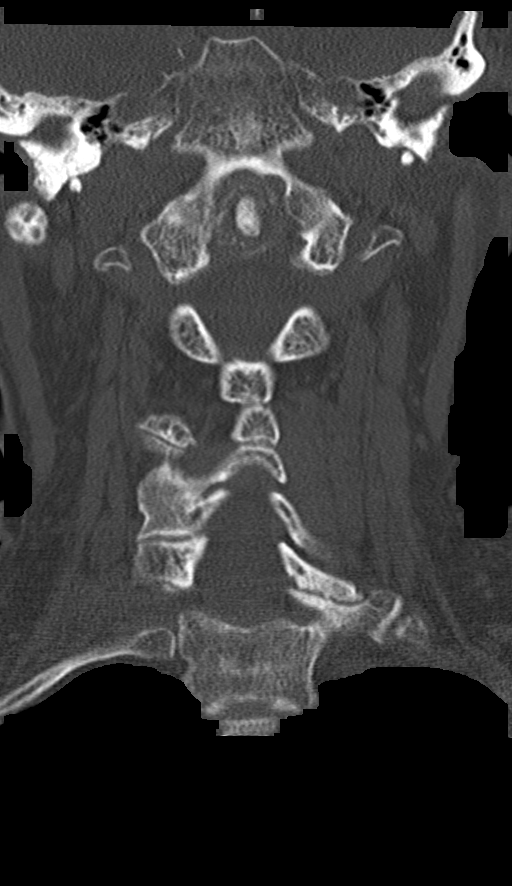
[im 40/67  bone]
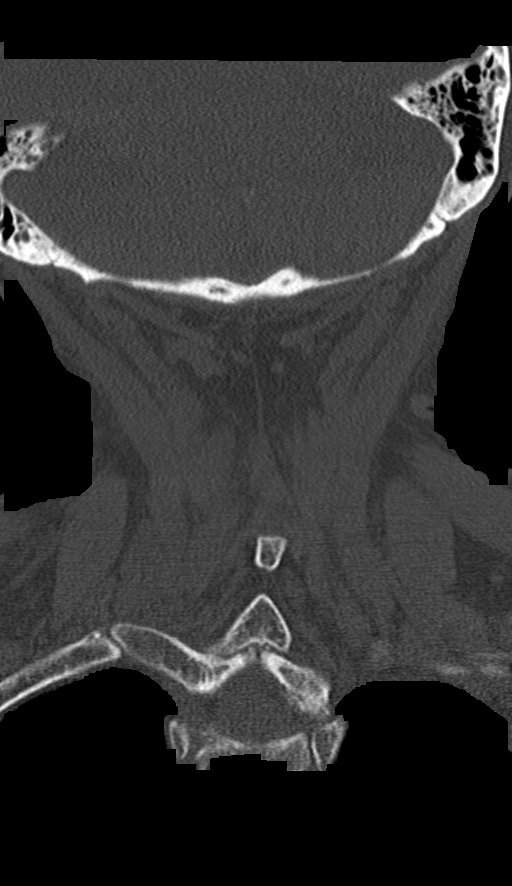

[Series 8: sagittal bone · sagittal · 0.27mm/px · 5 of 61 slices shown, 6 images]
[im 21/61  bone]
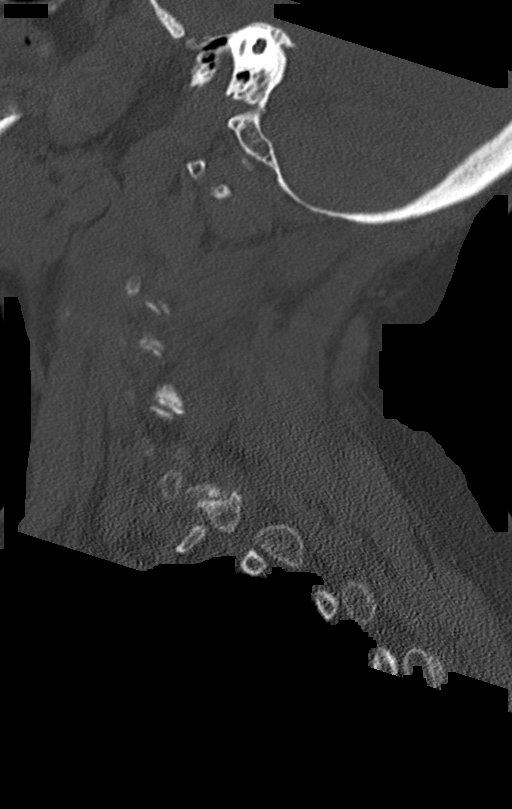
[im 26/61  bone]
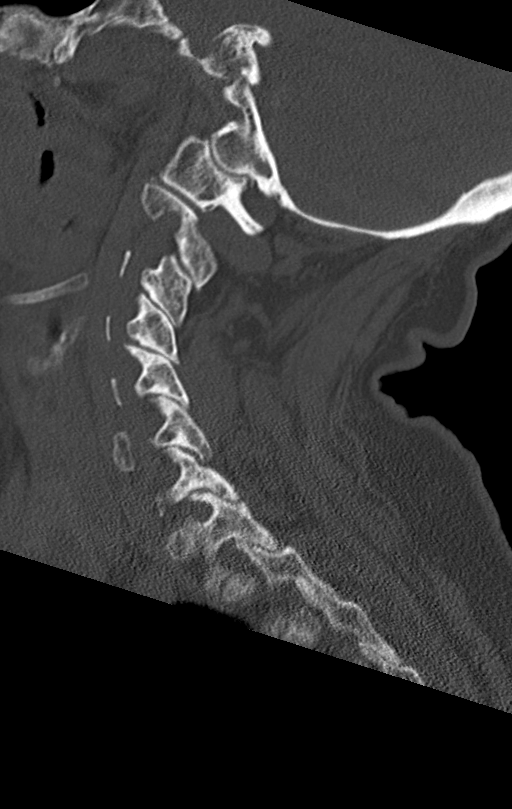
[im 31/61  soft-tissue]
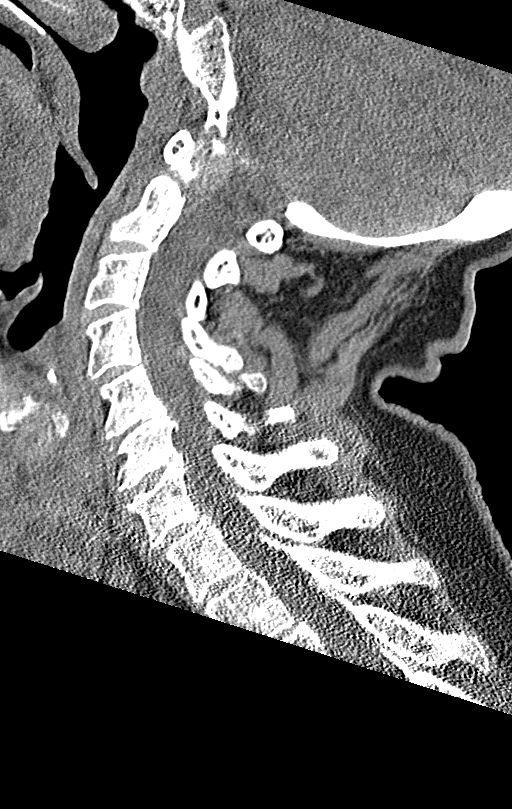
[im 31/61  bone]
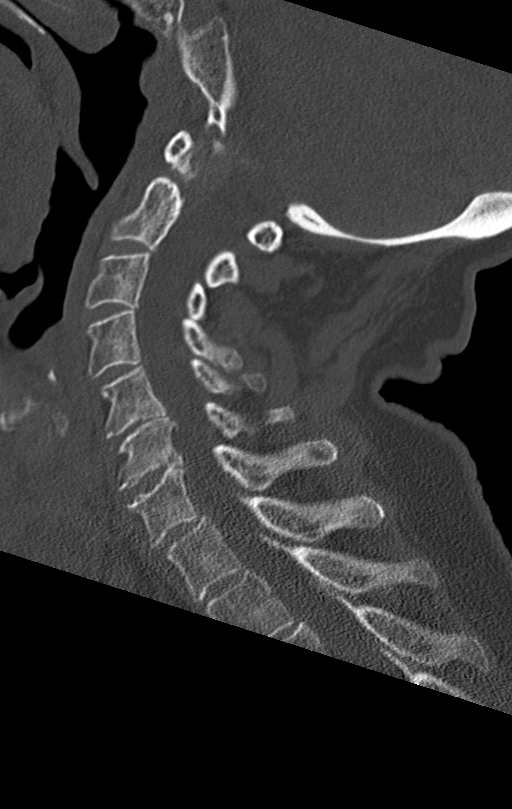
[im 36/61  bone]
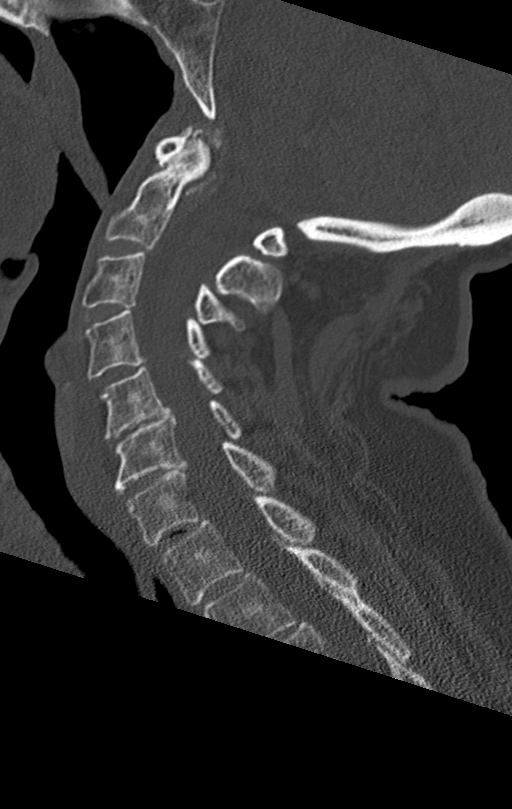
[im 41/61  bone]
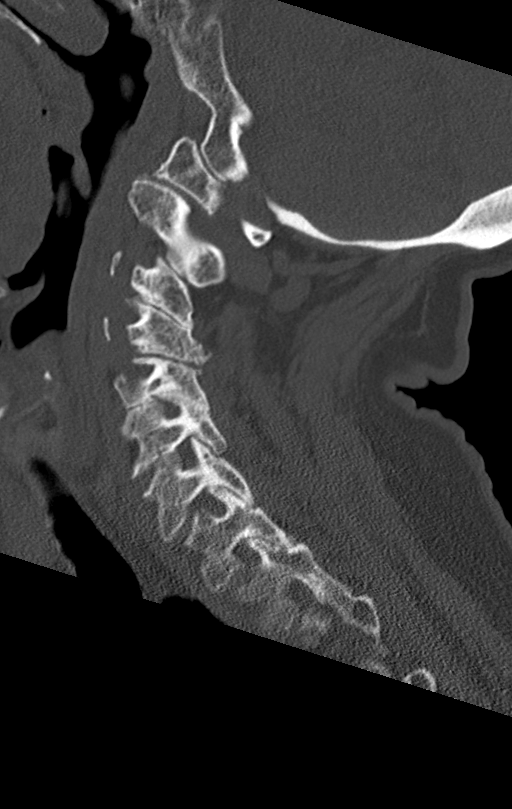

[11 of 33 positions shown; findings below may reference images not displayed]

FINDINGS: Alignment: Mild exaggeration of cervical lordosis. Facet alignment
is maintained. No subluxation.

Skull base and vertebrae: No acute fracture. No primary bone lesion
or focal pathologic process.

Soft tissues and spinal canal: No prevertebral fluid or swelling. No
visible canal hematoma.

Disc levels: Moderate degenerative changes at C5-C6 and C6-C7.
Multiple level facet degenerative changes with fusion of right facet
at C5-C6.

Upper chest: 3.3 cm heterogenous left thyroid nodule.

Other: None
IMPRESSION: 1. Degenerative changes of the cervical spine. No acute osseous
abnormality.

## 2022-09-28 DEATH — deceased
